# Patient Record
Sex: Female | Born: 1980 | Race: White | Hispanic: No | Marital: Married | Smoking: Never smoker
Health system: Southern US, Community
[De-identification: ages and names within clinical notes are randomized; demographics above are authoritative.]

## PROBLEM LIST (undated history)

## (undated) DIAGNOSIS — R519 Headache, unspecified: Secondary | ICD-10-CM

## (undated) DIAGNOSIS — E042 Nontoxic multinodular goiter: Secondary | ICD-10-CM

## (undated) DIAGNOSIS — K219 Gastro-esophageal reflux disease without esophagitis: Secondary | ICD-10-CM

## (undated) DIAGNOSIS — K921 Melena: Secondary | ICD-10-CM

## (undated) DIAGNOSIS — N390 Urinary tract infection, site not specified: Secondary | ICD-10-CM

## (undated) DIAGNOSIS — R51 Headache: Secondary | ICD-10-CM

## (undated) DIAGNOSIS — K589 Irritable bowel syndrome without diarrhea: Secondary | ICD-10-CM

## (undated) DIAGNOSIS — B019 Varicella without complication: Secondary | ICD-10-CM

## (undated) DIAGNOSIS — E049 Nontoxic goiter, unspecified: Secondary | ICD-10-CM

## (undated) HISTORY — DX: Gastro-esophageal reflux disease without esophagitis: K21.9

## (undated) HISTORY — PX: TONSILLECTOMY AND ADENOIDECTOMY: SUR1326

## (undated) HISTORY — DX: Varicella without complication: B01.9

## (undated) HISTORY — DX: Nontoxic multinodular goiter: E04.2

## (undated) HISTORY — DX: Irritable bowel syndrome, unspecified: K58.9

## (undated) HISTORY — DX: Nontoxic goiter, unspecified: E04.9

## (undated) HISTORY — DX: Headache, unspecified: R51.9

## (undated) HISTORY — DX: Melena: K92.1

## (undated) HISTORY — DX: Headache: R51

## (undated) HISTORY — DX: Urinary tract infection, site not specified: N39.0

---
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
## 2001-12-05 DIAGNOSIS — K219 Gastro-esophageal reflux disease without esophagitis: Secondary | ICD-10-CM

## 2001-12-05 HISTORY — DX: Gastro-esophageal reflux disease without esophagitis: K21.9

## 2001-12-07 ENCOUNTER — Other Ambulatory Visit: Admission: RE | Admit: 2001-12-07 | Discharge: 2001-12-07 | Payer: Self-pay | Admitting: Obstetrics and Gynecology

## 2002-12-09 ENCOUNTER — Other Ambulatory Visit: Admission: RE | Admit: 2002-12-09 | Discharge: 2002-12-09 | Payer: Self-pay | Admitting: Obstetrics and Gynecology

## 2003-12-12 ENCOUNTER — Other Ambulatory Visit: Admission: RE | Admit: 2003-12-12 | Discharge: 2003-12-12 | Payer: Self-pay | Admitting: Obstetrics and Gynecology

## 2004-11-12 ENCOUNTER — Other Ambulatory Visit: Admission: RE | Admit: 2004-11-12 | Discharge: 2004-11-12 | Payer: Self-pay | Admitting: Obstetrics and Gynecology

## 2005-11-04 ENCOUNTER — Other Ambulatory Visit: Admission: RE | Admit: 2005-11-04 | Discharge: 2005-11-04 | Payer: Self-pay | Admitting: Obstetrics and Gynecology

## 2007-01-05 ENCOUNTER — Other Ambulatory Visit: Admission: RE | Admit: 2007-01-05 | Discharge: 2007-01-05 | Payer: Self-pay | Admitting: Obstetrics and Gynecology

## 2007-06-05 HISTORY — PX: DILATION AND CURETTAGE OF UTERUS: SHX78

## 2007-06-10 LAB — ANTIBODY SCREEN: Antibody Screen: NEGATIVE

## 2007-06-18 ENCOUNTER — Inpatient Hospital Stay (HOSPITAL_COMMUNITY): Admission: AD | Admit: 2007-06-18 | Discharge: 2007-06-19 | Payer: Self-pay | Admitting: Obstetrics and Gynecology

## 2007-06-20 ENCOUNTER — Ambulatory Visit (HOSPITAL_COMMUNITY): Admission: RE | Admit: 2007-06-20 | Discharge: 2007-06-20 | Payer: Self-pay | Admitting: Obstetrics and Gynecology

## 2007-06-20 ENCOUNTER — Encounter (INDEPENDENT_AMBULATORY_CARE_PROVIDER_SITE_OTHER): Payer: Self-pay | Admitting: Obstetrics and Gynecology

## 2008-02-13 IMAGING — US US OB COMP LESS 14 WK
1 series · 14 of 28 positions shown · non-contrast
Comparison: None.

CLINICAL DATA: 25-year-old who is 12 weeks pregnant with bleeding and cramping.
 OBSTETRICAL ULTRASOUND <14 WKS AND TRANSVAGINAL OB US:
TECHNIQUE: Both transabdominal and transvaginal ultrasound examinations were performed for complete evaluation of the gestation as well as the maternal uterus, adnexal regions, and pelvic cul-de-sac.

[Series 1: us ob comp less 14 wks · 31 acquisitions, 14 frames shown]
[im 2/31]
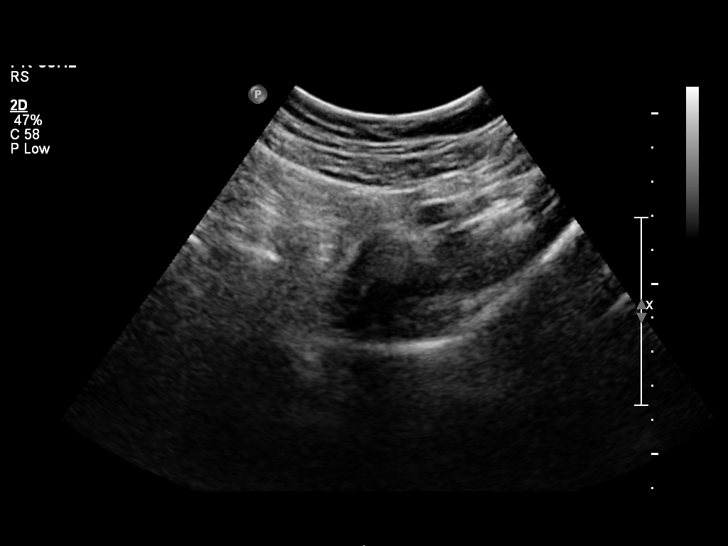
[im 4/31]
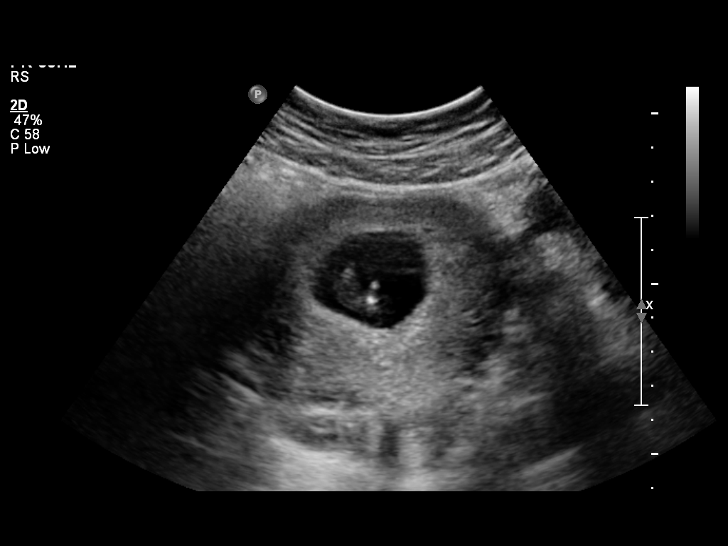
[im 6/31]
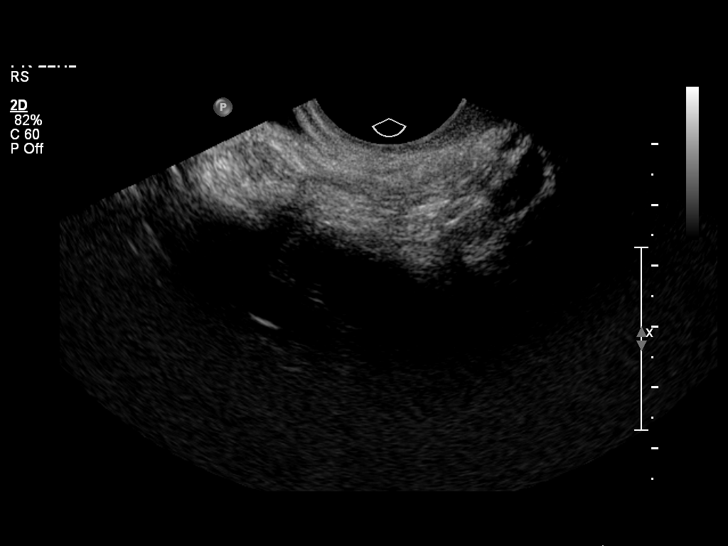
[im 8/31]
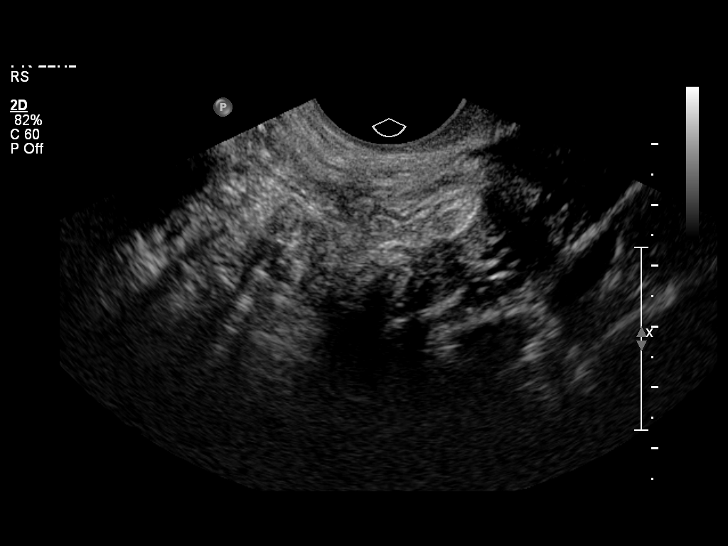
[im 11/31]
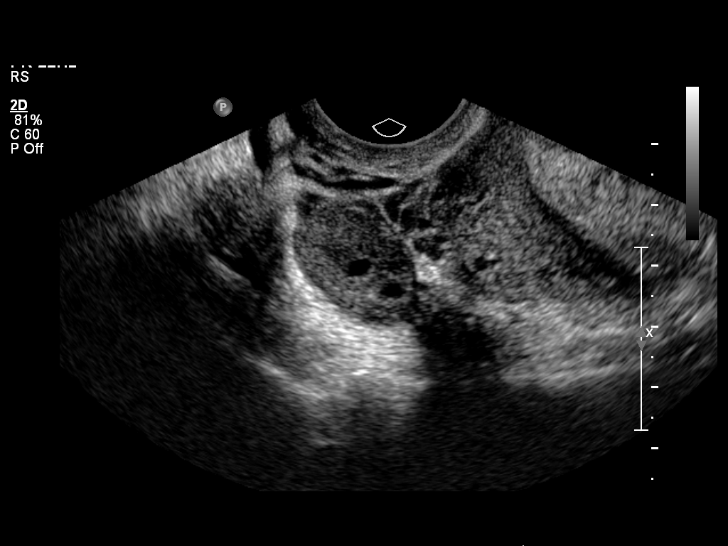
[im 13/31]
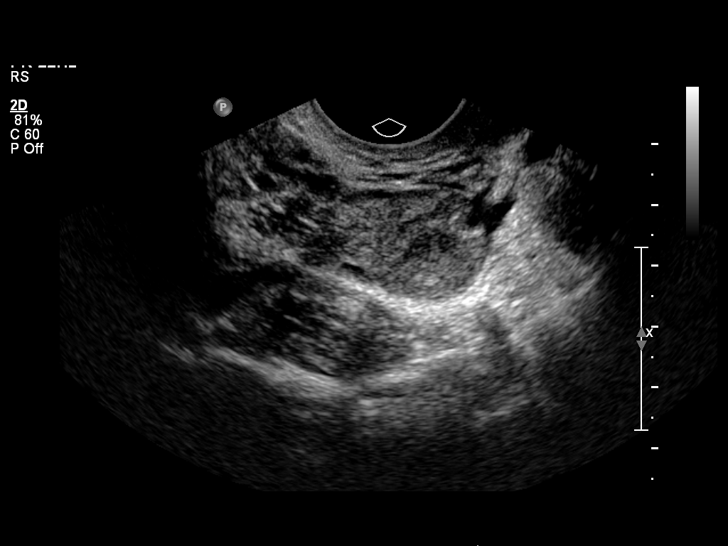
[im 15/31]
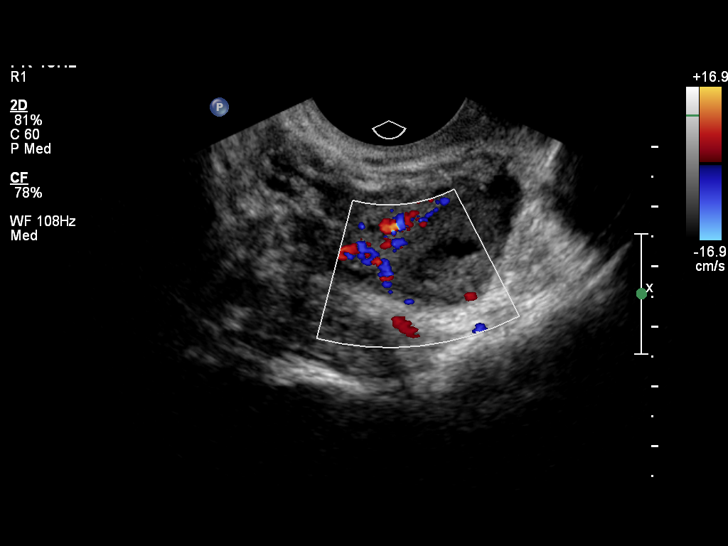
[im 17/31]
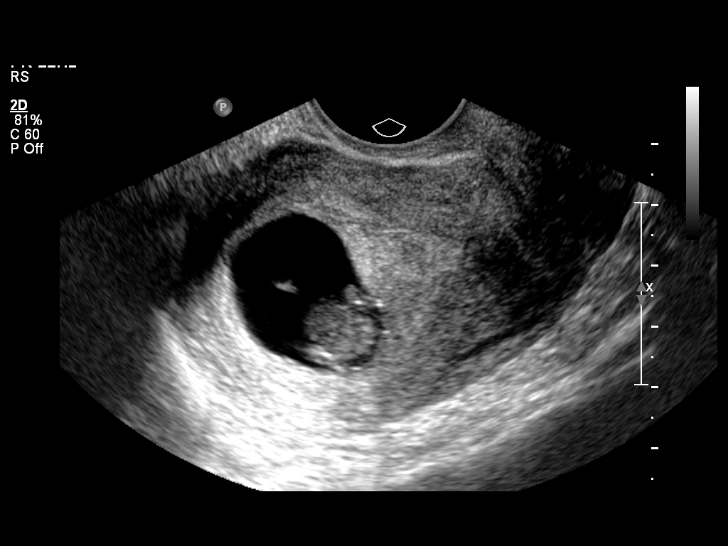
[im 19/31]
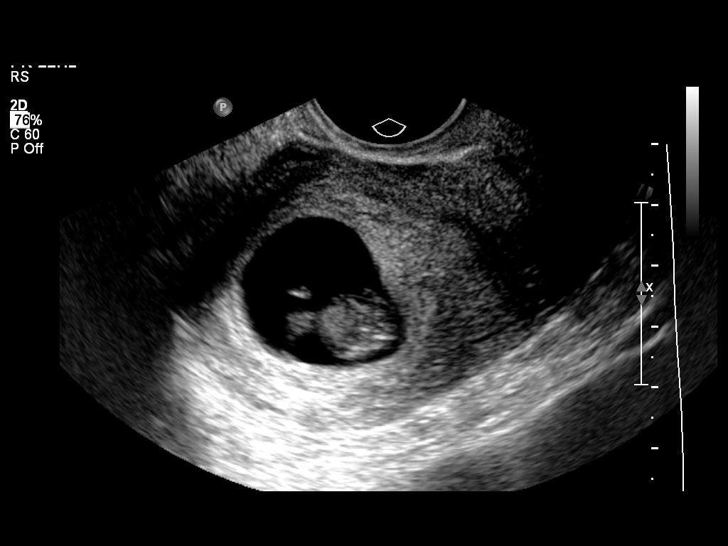
[im 22/31]
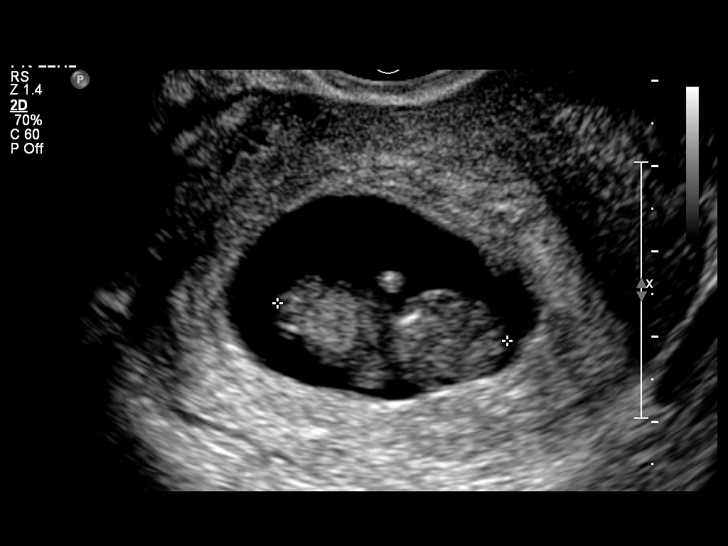
[im 24/31]
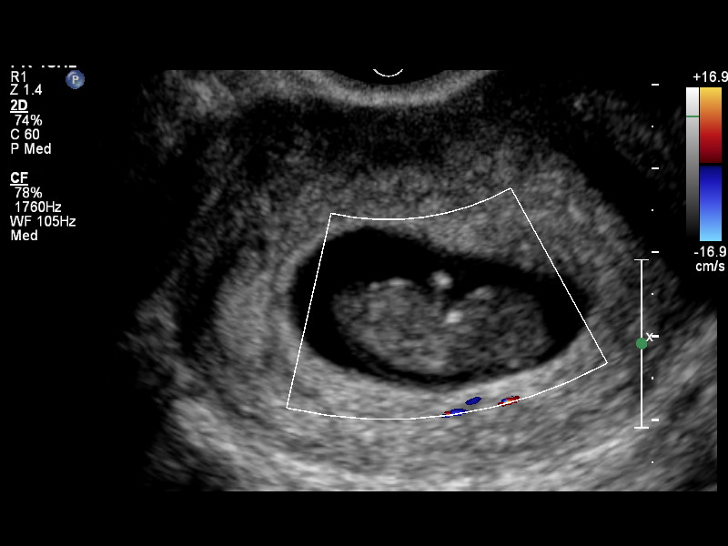
[im 26/31]
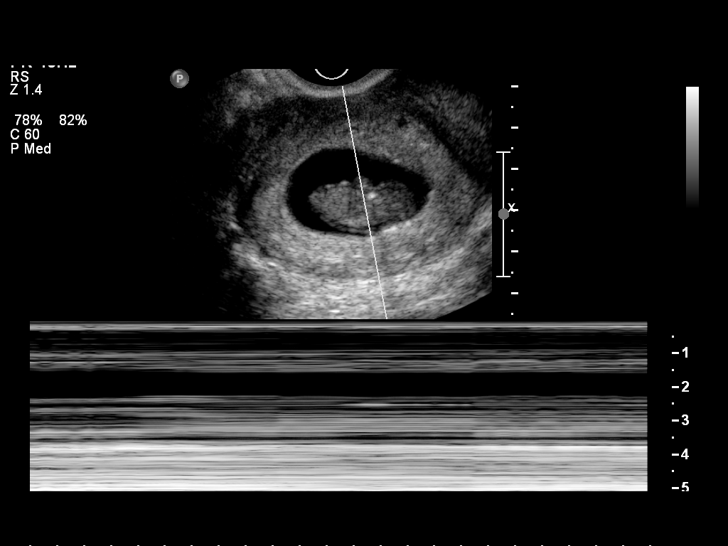
[im 28/31]
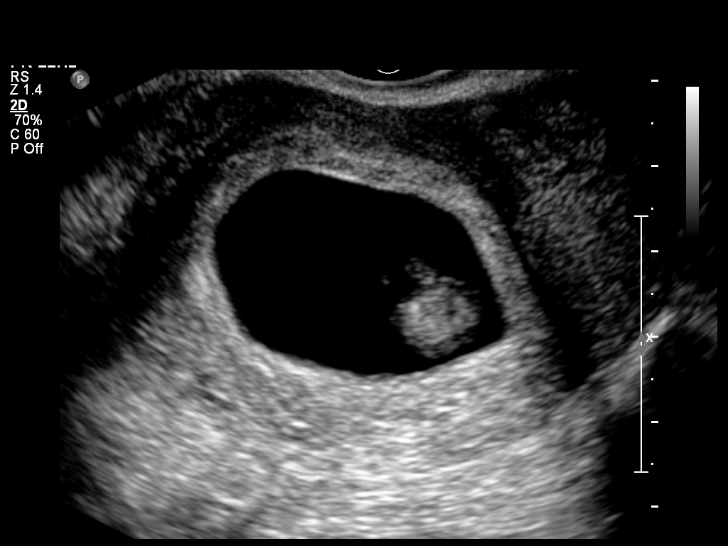
[im 31/31]
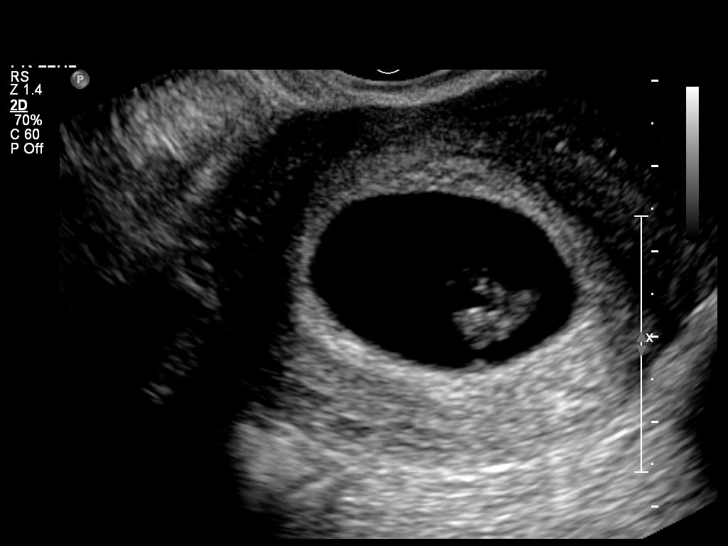

[14 of 28 positions shown; findings below may reference images not displayed]

FINDINGS: There is a single intrauterine gestational sac with a small fetal pole measuring 2.72 cm, which is consistent with a 9 week 4 day gestation. No cardiac activity is demonstrated. No yolk sac is seen.  No subchorionic hemorrhage.  The right ovary is visualized and appears normal. The left ovary is not seen. No free fluid.
IMPRESSION: Fetal pole measuring 9 weeks and 4 days but no cardiac activity is seen.  Findings are consistent with fetal demise.

## 2008-04-30 ENCOUNTER — Inpatient Hospital Stay (HOSPITAL_COMMUNITY): Admission: AD | Admit: 2008-04-30 | Discharge: 2008-04-30 | Payer: Self-pay | Admitting: Obstetrics and Gynecology

## 2008-04-30 ENCOUNTER — Inpatient Hospital Stay (HOSPITAL_COMMUNITY): Admission: AD | Admit: 2008-04-30 | Discharge: 2008-05-02 | Payer: Self-pay | Admitting: Obstetrics and Gynecology

## 2009-08-12 ENCOUNTER — Ambulatory Visit (HOSPITAL_COMMUNITY): Admission: RE | Admit: 2009-08-12 | Discharge: 2009-08-12 | Payer: Self-pay | Admitting: Obstetrics and Gynecology

## 2009-12-09 ENCOUNTER — Inpatient Hospital Stay (HOSPITAL_COMMUNITY): Admission: AD | Admit: 2009-12-09 | Discharge: 2009-12-10 | Payer: Self-pay | Admitting: Obstetrics and Gynecology

## 2011-02-20 LAB — CBC
HCT: 30.2 % — ABNORMAL LOW (ref 36.0–46.0)
Hemoglobin: 11.6 g/dL — ABNORMAL LOW (ref 12.0–15.0)
MCV: 92 fL (ref 78.0–100.0)
RBC: 3.28 MIL/uL — ABNORMAL LOW (ref 3.87–5.11)
RBC: 3.82 MIL/uL — ABNORMAL LOW (ref 3.87–5.11)
WBC: 11.4 10*3/uL — ABNORMAL HIGH (ref 4.0–10.5)

## 2011-02-20 LAB — RPR: RPR Ser Ql: NONREACTIVE

## 2011-04-19 NOTE — Op Note (Signed)
NAMEWHITNEY, Sherry Chavez             ACCOUNT NO.:  [id number]   MEDICAL RECORD NO.:  [id number]          PATIENT TYPE:  AMB   LOCATION:  SDC                           [facility]   PHYSICIAN:  Janine Limbo, M.D.DATE OF BIRTH:  October 10, 1981   DATE OF PROCEDURE:  06/20/2007  DATE OF DISCHARGE:                               OPERATIVE REPORT   PREOPERATIVE DIAGNOSIS:  First trimester incomplete abortion.   POSTOPERATIVE DIAGNOSIS:  First trimester incomplete abortion.   PROCEDURE:  Suction dilatation and evacuation.   SURGEON:  Dr. Leonard Schwartz   ASSISTANT:  None.   ANESTHETIC:  Is general.   DISPOSITION:  The patient is a 30 year old female, gravida 2, para 0-0-1-  0, who presents with the first trimester miscarriage confirmed by  ultrasound.  She understands the indications for her surgical procedure  and she accepts the risks of, but not limited to, anesthetic  complications, bleeding, infection, and possible damage to surrounding  organs.   FINDINGS:  The patient's blood type is A+.  The uterus was approximately  8-10 weeks size.  A moderate amount of products of conception were  removed from within the uterus.   PROCEDURE:  The patient was taken to the operating room where general  anesthetic was given.  The patient's lower abdomen, perineum, and vagina  were prepped with multiple layers of Betadine.  The bladder was drained  of urine.  The patient was sterilely draped.  Examination under  anesthesia was performed.  A paracervical block was placed using 10 mL  of half percent Marcaine with epinephrine.  An additional 10 mL of half  percent Marcaine with epinephrine were injected directly into the  cervix.  The uterus sounded to 9 cm.  The cervix was gently dilated.  The uterine cavity was evacuated using a size 8 suction curette followed  by medium sharp curette.  The cavity was felt to be clean at the end of  our procedure.  Hemostasis was adequate.  All  instruments were removed.  The examination was repeated and the uterus was noted to be firm.  The  patient was awakened from her anesthetic and taken to the recovery room  in stable condition.  She tolerated her procedure well.  The products of  conception was sent to pathology for evaluation.   FOLLOW-UP INSTRUCTIONS:  The patient was given a prescription for  ibuprofen and she will take 800 mg every 8 hours as needed for pain.  She will also take Darvocet-N 100 1 or 2 tablets every 4-6 hours as  needed for  pain.  She will return to see Dr. Stefano Gaul in 2-3 weeks for follow-up  examination.  She was given a copy of the postoperative instruction  sheet as prepared by the [facility] for patients who  have undergone a dilatation and curettage.      Janine Limbo, M.D.  Electronically Signed     AVS/MEDQ  D:  06/20/2007  T:  06/20/2007  Job:  [id number]

## 2011-04-19 NOTE — H&P (Signed)
NAMECAYLEE, Sherry Chavez             ACCOUNT NO.:  [id number]   MEDICAL RECORD NO.:  [id number]           PATIENT TYPE:   LOCATION:  MATC                          [facility]:  [facility]   PHYSICIAN:  Janine Limbo, M.D.DATE OF BIRTH:  Mar 18, 1981   DATE OF ADMISSION:  06/20/2007  DATE OF DISCHARGE:                              HISTORY & PHYSICAL   HISTORY:  Ms. Sherry Chavez is a 30 year old female, gravida 2, para 0, 0, 1,  0, who presents at [redacted] weeks gestation (EDC is December 30, 2007).  She  has an incomplete miscarriage.  She has been followed at the [facility] and [facility] Division of [facility].  The patient had an ultrasound performed on June 18, 2007 that  showed a non viable first trimester miscarriage.   OBSTETRICAL HISTORY:  The patient had a miscarriage in the first  trimester in December of 2007.   ALLERGIES:  No known drug allergies but CODEINE causes vomiting.   PAST MEDICAL HISTORY:  The patient denies hypertension and diabetes.  She does have irritable bowel syndrome.  She had her wisdom teeth  removed at age 27. She had her tonsils and adenoids removed at age 89.   SOCIAL HISTORY:  The patient denies cigarette use, alcohol use, and  recreational drug use.   REVIEW OF SYSTEMS:  Noncontributory.   FAMILY HISTORY:  Noncontributory.   PHYSICAL EXAMINATION:  Weight is 150 pounds.  HEENT:  Within normal limits.  CHEST:  Clear.  HEART:  Regular rate and rhythm.  BREASTS:  Are without masses.  ABDOMEN:  Nontender.  EXTREMITIES:  Are grossly normal.  NEUROLOGIC EXAM:  Grossly normal.   PELVIC EXAM:  External genitalia is normal, vagina is normal, cervix is  nontender. Uterus is approximately 10-weeks size when last checked.  Adnexa no masses.   LABORATORY VALUES:  Blood type is A positive, antibody screen negative.  VDRL nonreactive.  HBSAG negative.  HIV nonreactive.   Ultrasound - non viable first trimester gestation.   ASSESSMENT:  First trimester incomplete abortion.   PLAN:  The patient will undergo dilatation and evacuation.  She  understands the indications for her surgical procedure and she accepts  the risk of, but not limited to, anesthetic complications, bleeding,  infections, and possible damage to the surrounding organs.      Janine Limbo, M.D.  Electronically Signed     AVS/MEDQ  D:  06/19/2007  T:  06/19/2007  Job:  [id number]

## 2011-04-19 NOTE — H&P (Signed)
Sherry Chavez, Sherry Chavez             ACCOUNT NO.:  [id number]   MEDICAL RECORD NO.:  [id number]          PATIENT TYPE:  INP   LOCATION:  9165                          [facility]ILITY:  [facility]   PHYSICIAN:  Crist Fat. Rivard, M.D. DATE OF BIRTH:  1981/05/20   DATE OF ADMISSION:  04/30/2008  DATE OF DISCHARGE:                              HISTORY & PHYSICAL   Ms. Boulay is a 30 year old gravida 3, para 0-0-2-0, at 40-4/7 weeks,  who presented complaining of increased contractions since approximately  4 a.m.  She was seen earlier tonight in maternity admissions, with  cervix 1 cm, 80%, and no cervical change after walking.  She was sent  home.  She reports she slept approximately 1 hour and then awoke with  increased contractions and some questionable leaking.   Pregnancy has been remarkable for:  1. Two SABs.  2. Family history of neural tube defect, hydrocephaly, in the      patient's sister.  3. Allergy to CODEINE.  4. Irritable bowel syndrome, but stable during pregnancy.  5. Group B Streptococcus negative.   PRENATAL LABORATORIES:  Blood type is A positive, Rh antibody negative,  VDRL nonreactive, rubella titer positive, hepatitis B surface antigen  negative, HIV is nonreactive.  Cystic fibrosis testing was declined.  Hemoglobin upon entering into practice was 14.1.  It was 12 at 27 weeks.  GC and Chlamydia cultures were negative.  First trimester screen was  normal.  AFP was normal.  Her Glucola was normal at 89.  Group B  Streptococcus culture was done at 35 weeks and was negative.  She had a  negative fetal fibronectin at 33 weeks.   HISTORY OF PRESENT PREGNANCY:  The patient entered care at approximately  9 weeks.  She had had some first trimester spotting.  She was on  progesterone suppositories secondary to history of two SABs.  She had an  ultrasound at her first visit secondary to inability to hear fetal heart  tones.  EDC was Apr 26, 2008 by LMP, Apr 30, 2008 by a 6-week  ultrasound, and Apr 29, 2008 by a 9-week ultrasound.  She had a normal  first trimester screen and normal AFP.  She had an ultrasound in 19  weeks showing normal growth.  She had some cramping at 20 weeks.  She  had a normal Glucola.  She did have some insomnia at 29 weeks, did not  require any prescriptive medications.  She had lots of contractions at  33 weeks.  Fetal fibronectin was done and was negative, and cervix  closed and long.  Group B Streptococcus culture other cultures were  negative at 35 weeks.  The rest of her pregnancy was essentially  uncomplicated.   OBSTETRICAL HISTORY:  In 2007 and 2008. the patient had miscarriages.  In 2007, it was a 5-week miscarriage which passed spontaneously.  In  2008, it was a 10-week miscarriage that required a D&C.   MEDICAL HISTORY:  She was on oral contraceptives until November 2007.  She does have a history of irritable bowel diagnosed to 2007, with hers  being remarkable for diarrhea.  She has had no medications.  She reports  one UTI.  She has had surgical history with wisdom teeth removed at age  40, tonsils and adenoids at age 63, and a previously noted 2008 D&E.  She did have some vomiting and nausea upon awakening from that.   The patient is allergic to CODEINE, which causes vomiting and stomach  pain.   FAMILY HISTORY:  Her mother is hypertensive, on medication.  Her mother  has varicosities and anemia.  Her mother does have some glucose  instability.  Her maternal grandmother is a diabetic.  Her mother has  some type of adrenal problem but has not required any treatment.  Her  mother has migraines.  Maternal grandfather is an alcohol user.   GENETIC HISTORY:  Remarkable the patient's sister having spina bifida  and hydrocephalus with a shunt.  She had an L4-L5 lesion.  She is able  to walk.   SOCIAL HISTORY:  The patient is married to the father of the baby.  He  is involved and supportive.  His name is Erick Oxendine.  The  patient is  Caucasian, of the [place] faith.  The patient has a college degree.  She is a Engineering geologist.  Her husband has two years of college.  He is  an Engineer, petroleum.  She has been followed by the certified years  midwife service at [facility].  She denies any alcohol, drug,  or tobacco use during this pregnancy.   PHYSICAL EXAMINATION:  VITAL SIGNS:  Stable.  The patient is afebrile.  HEENT:  Within normal limits.  LUNGS:  Breath sounds are clear.  HEART:  Regular rate and rhythm, without murmur.  BREASTS:  Soft and nontender.  ABDOMEN:  Fundal height is approximately 39 cm.  Estimated fetal weight  7 - 7-1/2 pounds.  Uterine contractions every 4-5 minutes, moderate  quality.  Cervix was 3 cm, 80% vertex, at a -1 station, with bulging bag  of water per the nurse in maternity admissions.  No leaking was noted.  Fetal heart rate has been reactive, with no decelerations.  EXTREMITIES:  Deep tendon reflexes are 2+, without clonus.  There is  trace edema noted.   IMPRESSION:  1. Intrauterine pregnancy at 40-4/7 weeks.  2. Early labor.  3. Negative group B Streptococcus.   PLAN:  1. Birthing suite consult with Dr. Estanislado Pandy as attending physician.  2. Routine certified nurse midwife orders.  3. The patient declines pain medication at present.  4. Will recheck this morning after the patient walks per request.      Renaldo Reel. Emilee Hero, C.N.M.      Crist Fat Rivard, M.D.  Electronically Signed    VLL/MEDQ  D:  04/30/2008  T:  04/30/2008  Job:  [id number]

## 2011-08-18 LAB — RPR: RPR: NONREACTIVE

## 2011-08-18 LAB — GC/CHLAMYDIA PROBE AMP, GENITAL

## 2011-08-31 LAB — CBC
HCT: 36.3
Hemoglobin: 12.4
Hemoglobin: 9.8 — ABNORMAL LOW
MCHC: 34
MCV: 87.6
MCV: 88.7
Platelets: 241
RDW: 13.7
RDW: 14.3
WBC: 14.4 — ABNORMAL HIGH

## 2011-09-19 LAB — URINE MICROSCOPIC-ADD ON

## 2011-09-19 LAB — GC/CHLAMYDIA PROBE AMP, GENITAL: Chlamydia, DNA Probe: NEGATIVE

## 2011-09-19 LAB — URINALYSIS, ROUTINE W REFLEX MICROSCOPIC
Bilirubin Urine: NEGATIVE
Glucose, UA: NEGATIVE
Ketones, ur: NEGATIVE
Nitrite: NEGATIVE
Specific Gravity, Urine: 1.015
pH: 7.5

## 2011-09-19 LAB — CBC
HCT: 40.5
Hemoglobin: 13.6
RBC: 4.6
WBC: 6.2

## 2011-09-20 LAB — URINALYSIS, ROUTINE W REFLEX MICROSCOPIC
Glucose, UA: NEGATIVE
Protein, ur: NEGATIVE
Specific Gravity, Urine: 1.015
pH: 6

## 2011-09-20 LAB — URINE MICROSCOPIC-ADD ON

## 2011-12-06 NOTE — L&D Delivery Note (Signed)
Delivery Note At 9:31 PM a viable and healthy female was delivered via  (Presentation: Right Occiput Anterior with posterior arm by head).  APGAR: 9, 9; weight 7 lb 11 oz (3487 g).   Placenta status: Intact, Spontaneous.  Cord: 3 vessels with the following complications: None.  Cord pH:   Anesthesia:  None Episiotomy: None Lacerations: 1st degree Superficial Suture Repair: None Est. Blood Loss (mL): 250  Mom to postpartum.  Baby to nursery-stable.  Delivery supervised by Philipp Deputy, CNM  Ala Dach 02/28/2012, 9:53 PM

## 2012-02-03 LAB — STREP B DNA PROBE: GBS: NEGATIVE

## 2012-02-28 ENCOUNTER — Inpatient Hospital Stay (HOSPITAL_COMMUNITY)
Admission: AD | Admit: 2012-02-28 | Discharge: 2012-03-01 | DRG: 775 | Disposition: A | Discharge status: Home / Self Care | Payer: 59 | Source: Ambulatory Visit | Attending: Obstetrics and Gynecology | Admitting: Obstetrics and Gynecology

## 2012-02-28 ENCOUNTER — Encounter (HOSPITAL_COMMUNITY): Payer: Self-pay | Admitting: Obstetrics

## 2012-02-28 LAB — CBC
HCT: 36.7 % (ref 36.0–46.0)
Hemoglobin: 12.2 g/dL (ref 12.0–15.0)
MCV: 87.6 fL (ref 78.0–100.0)
Platelets: 215 10*3/uL (ref 150–400)
RBC: 4.19 MIL/uL (ref 3.87–5.11)
WBC: 16.5 10*3/uL — ABNORMAL HIGH (ref 4.0–10.5)

## 2012-02-28 MED ORDER — ACETAMINOPHEN 325 MG PO TABS: 650.0000 mg | ORAL_TABLET | ORAL | Status: DC | PRN

## 2012-02-28 MED ORDER — LACTATED RINGERS IV SOLN: INTRAVENOUS | Status: DC

## 2012-02-28 MED ORDER — ONDANSETRON HCL 4 MG/2ML IJ SOLN: 4.0000 mg | Freq: Four times a day (QID) | INTRAMUSCULAR | Status: DC | PRN

## 2012-02-28 MED ORDER — IBUPROFEN 600 MG PO TABS
600.0000 mg | ORAL_TABLET | Freq: Four times a day (QID) | ORAL | Status: DC | PRN
  Administered 2012-02-28: 600 mg via ORAL
  Filled 2012-02-28: qty 1

## 2012-02-28 MED ORDER — LACTATED RINGERS IV SOLN: 500.0000 mL | INTRAVENOUS | Status: DC | PRN

## 2012-02-28 MED ORDER — OXYTOCIN 20 UNITS IN LACTATED RINGERS INFUSION - SIMPLE: 125.0000 mL/h | Freq: Once | INTRAVENOUS | Status: DC

## 2012-02-28 MED ORDER — CITRIC ACID-SODIUM CITRATE 334-500 MG/5ML PO SOLN: 30.0000 mL | ORAL | Status: DC | PRN

## 2012-02-28 MED ORDER — OXYTOCIN BOLUS FROM INFUSION
500.0000 mL | Freq: Once | INTRAVENOUS | Status: DC
  Filled 2012-02-28: qty 500

## 2012-02-28 MED ORDER — FLEET ENEMA 7-19 GM/118ML RE ENEM: 1.0000 | ENEMA | RECTAL | Status: DC | PRN

## 2012-02-28 MED ORDER — LIDOCAINE HCL (PF) 1 % IJ SOLN: 30.0000 mL | INTRAMUSCULAR | Status: DC | PRN

## 2012-02-28 MED ORDER — OXYCODONE-ACETAMINOPHEN 5-325 MG PO TABS: 1.0000 | ORAL_TABLET | ORAL | Status: DC | PRN

## 2012-02-28 NOTE — H&P (Signed)
Sherry Chavez is a 31 y.o. female [id number] who presents at 39 weeks and 4 days who presents with strong contractions and urge to push. Membranes were stripped in office today. Contractions became strong and frequent after that, every couple minutes. Prenatal care at [facility]. No complications with pregnancy. Gush of fluid in stretcher during transfer in L&D. Precipitous delivery. Estimated Date of Delivery: 03/02/2012 based on LMP and early U/S. Plans to breast feed. Contraception will be vasectomy. Pediatrician will be Dr. Milford Cage in [place].  Maternal Medical History:  Contractions: Frequency: rare.      No past medical history on file. Past Medical History None Past Surgical History: D&C, T&A Allergies: Codeine No past surgical history on file. Family History: family history is not on file. Social History:  does not have a smoking history on file. She does not have any smokeless tobacco history on file. Her alcohol and drug histories not on file.  ROS See HPI  Dilation: 7.5 Effacement (%): 80 Station: -3 Exam by:: DN Blood pressure 121/67, pulse 100. Exam Physical Exam  Constitutional: She is oriented to person, place, and time. She appears well-developed and well-nourished. She appears distressed.  HENT:  Head: Normocephalic and atraumatic.  Eyes: EOM are normal.  Neck: Normal range of motion.  Respiratory: Effort normal. No respiratory distress.  GI: She exhibits distension (gravid).  Genitourinary: Vagina normal.  Musculoskeletal: Normal range of motion.  Neurological: She is alert and oriented to person, place, and time. No cranial nerve deficit.  Skin: Skin is warm and dry. She is not diaphoretic. No erythema.  Psychiatric: She has a normal mood and affect. Her behavior is normal. Judgment and thought content normal.    Prenatal labs: ABO, Rh:  A+ Antibody:   Rubella:  Immune RPR:   neg HBsAg:   neg HIV:   neg GBS:   neg 2 hr glucose 82, 109, 95 Quad screen  normal Anatomic U/S normal  Assessment/Plan: 31 y.o. female [id number] who presents at 39 weeks and 4 days Admit to L&D Precipitous Delivery Breast feed Vasectomy for contraception  Ala Dach 02/28/2012, 10:09 PM

## 2012-02-29 ENCOUNTER — Encounter (HOSPITAL_COMMUNITY): Payer: Self-pay | Admitting: *Deleted

## 2012-02-29 MED ORDER — DIPHENHYDRAMINE HCL 25 MG PO CAPS: 25.0000 mg | ORAL_CAPSULE | Freq: Four times a day (QID) | ORAL | Status: DC | PRN

## 2012-02-29 MED ORDER — OXYCODONE-ACETAMINOPHEN 5-325 MG PO TABS: 1.0000 | ORAL_TABLET | ORAL | Status: DC | PRN

## 2012-02-29 MED ORDER — TETANUS-DIPHTH-ACELL PERTUSSIS 5-2.5-18.5 LF-MCG/0.5 IM SUSP: 0.5000 mL | Freq: Once | INTRAMUSCULAR | Status: DC

## 2012-02-29 MED ORDER — WITCH HAZEL-GLYCERIN EX PADS: 1.0000 "application " | MEDICATED_PAD | CUTANEOUS | Status: DC | PRN

## 2012-02-29 MED ORDER — IBUPROFEN 600 MG PO TABS
600.0000 mg | ORAL_TABLET | Freq: Four times a day (QID) | ORAL | Status: DC
  Administered 2012-02-29 – 2012-03-01 (×5): 600 mg via ORAL
  Filled 2012-02-29 (×4): qty 1

## 2012-02-29 MED ORDER — ONDANSETRON HCL 4 MG PO TABS: 4.0000 mg | ORAL_TABLET | ORAL | Status: DC | PRN

## 2012-02-29 MED ORDER — ONDANSETRON HCL 4 MG/2ML IJ SOLN: 4.0000 mg | INTRAMUSCULAR | Status: DC | PRN

## 2012-02-29 MED ORDER — SIMETHICONE 80 MG PO CHEW: 80.0000 mg | CHEWABLE_TABLET | ORAL | Status: DC | PRN

## 2012-02-29 MED ORDER — DIBUCAINE 1 % RE OINT: 1.0000 "application " | TOPICAL_OINTMENT | RECTAL | Status: DC | PRN

## 2012-02-29 MED ORDER — ZOLPIDEM TARTRATE 5 MG PO TABS: 5.0000 mg | ORAL_TABLET | Freq: Every evening | ORAL | Status: DC | PRN

## 2012-02-29 MED ORDER — BENZOCAINE-MENTHOL 20-0.5 % EX AERO: 1.0000 "application " | INHALATION_SPRAY | CUTANEOUS | Status: DC | PRN

## 2012-02-29 MED ORDER — LANOLIN HYDROUS EX OINT: TOPICAL_OINTMENT | CUTANEOUS | Status: DC | PRN

## 2012-02-29 MED ORDER — SENNOSIDES-DOCUSATE SODIUM 8.6-50 MG PO TABS
2.0000 | ORAL_TABLET | Freq: Every day | ORAL | Status: DC
  Administered 2012-02-29: 2 via ORAL

## 2012-02-29 MED ORDER — PRENATAL MULTIVITAMIN CH
1.0000 | ORAL_TABLET | Freq: Every day | ORAL | Status: DC
  Administered 2012-02-29: 1 via ORAL
  Filled 2012-02-29 (×2): qty 1

## 2012-02-29 NOTE — Progress Notes (Addendum)
Post Partum Day 1 Subjective: no complaints, up ad lib, voiding, tolerating PO and + flatus Breastfeeding is going well. No stool yet.  Objective: Blood pressure 110/73, pulse 77, temperature 98.1 F (36.7 C), temperature source Oral, resp. rate 16, height 5\' 5"  (1.651 m), weight 81.647 kg (180 lb), SpO2 95.00%, unknown if currently breastfeeding.  Physical Exam:  General: alert, cooperative, appears stated age and no distress Lochia: appropriate Uterine Fundus: firm Incision: n/a DVT Evaluation: No evidence of DVT seen on physical exam. Negative Homan's sign. No cords or calf tenderness. No significant calf/ankle edema.   Basename 02/28/12 2245  HGB 12.2  HCT 36.7    Assessment/Plan: Plan for discharge tomorrow, Breastfeeding and Contraception vasectomy   LOS: 1 day   Ala Dach 02/29/2012, 7:35 AM    I have seen and examined this patient and I agree with the above. Cam Hai 7:43 AM 02/29/2012

## 2012-02-29 NOTE — H&P (Signed)
Agree with above note.  Sherry Chavez 02/29/2012 7:56 AM

## 2012-03-01 NOTE — Progress Notes (Addendum)
Post Partum Day 2 Subjective: no complaints, up ad lib, voiding, tolerating PO and + flatus  Objective: Blood pressure 101/67, pulse 84, temperature 97.8 F (36.6 C), temperature source Oral, resp. rate 18, height 5\' 5"  (1.651 m), weight 81.647 kg (180 lb), last menstrual period 05/27/2011, SpO2 99.00%, unknown if currently breastfeeding.  Physical Exam:  General: alert, cooperative, appears stated age and no distress Lochia: appropriate Uterine Fundus: firm Incision: n/a DVT Evaluation: No evidence of DVT seen on physical exam. Negative Homan's sign. No cords or calf tenderness. No significant calf/ankle edema.   Basename 02/28/12 2245  HGB 12.2  HCT 36.7    Assessment/Plan: Discharge home, Breastfeeding and Contraception vasectomy   LOS: 2 days   Ala Dach 03/01/2012, 7:18 AM    I was present for the exam and agree with above.  Dorathy Kinsman 03/01/2012 9:57 AM

## 2012-03-01 NOTE — Discharge Summary (Signed)
Chart reviewed and agree with management and plan.  

## 2012-03-01 NOTE — Discharge Instructions (Signed)

## 2012-03-01 NOTE — Discharge Summary (Signed)
Obstetric Discharge Summary Reason for Admission: onset of labor Prenatal Procedures: none Intrapartum Procedures: spontaneous vaginal delivery Postpartum Procedures: none Complications-Operative and Postpartum: 1st degree superficial Hemoglobin  Date Value Range Status  02/28/2012 12.2  12.0-15.0 (g/dL) Final     HCT  Date Value Range Status  02/28/2012 36.7  36.0-46.0 (%) Final    Physical Exam:  General: alert, cooperative, appears stated age and no distress Lochia: appropriate Uterine Fundus: firm Incision: n/a DVT Evaluation: No evidence of DVT seen on physical exam. Negative Homan's sign. No cords or calf tenderness. No significant calf/ankle edema.  Discharge Diagnoses: Term Pregnancy-delivered  Discharge Information: Date: 03/01/2012 Activity: unrestricted Diet: routine Medications: None Condition: stable and improved Instructions: refer to practice specific booklet Discharge to: home Follow-up Information    Follow up with [facility]. Schedule an appointment as soon as possible for a visit in 6 weeks.   Contact information:   [address] [place] [address]          Newborn Data: Live born female  Birth Weight: 7 lb 11 oz (3487 g) APGAR: 9, 9  Home with mother.  Ala Dach 03/01/2012, 7:19 AM  I was present for the exam and agree with above.  Dorathy Kinsman 03/01/2012 9:58 AM

## 2012-10-18 ENCOUNTER — Encounter: Payer: Self-pay | Admitting: Gastroenterology

## 2012-10-19 ENCOUNTER — Encounter: Payer: Self-pay | Admitting: Gastroenterology

## 2012-11-09 ENCOUNTER — Ambulatory Visit: Payer: 59 | Admitting: Gastroenterology

## 2012-11-13 ENCOUNTER — Ambulatory Visit (INDEPENDENT_AMBULATORY_CARE_PROVIDER_SITE_OTHER): Payer: 59 | Admitting: Gastroenterology

## 2012-11-13 ENCOUNTER — Encounter: Payer: Self-pay | Admitting: Gastroenterology

## 2012-11-13 VITALS — BP 104/66 | HR 84 | Ht 64.75 in | Wt 150.4 lb

## 2012-11-13 DIAGNOSIS — R198 Other specified symptoms and signs involving the digestive system and abdomen: Secondary | ICD-10-CM

## 2012-11-13 DIAGNOSIS — K921 Melena: Secondary | ICD-10-CM

## 2012-11-13 DIAGNOSIS — R109 Unspecified abdominal pain: Secondary | ICD-10-CM

## 2012-11-13 MED ORDER — PEG-KCL-NACL-NASULF-NA ASC-C 100 G PO SOLR: 1.0000 | Freq: Once | ORAL | Status: DC

## 2012-11-13 NOTE — Progress Notes (Signed)
History of Present Illness: This is a 31 year old female who relates a change in bowel habits with looser stools associated with mucous and rectal bleeding and mild lower abdominal cramping. The rectal bleeding was occurring with most bowel movements for approximately 2 weeks but abated about one week ago. Frequent stools associated with mucous persists. She denies any antibiotic usage, new medications or new diet. When her rectal bleeding was occurring on a daily basis she noted regular low back pain. Denies weight loss, constipation, change in stool caliber, melena, nausea, vomiting, dysphagia, reflux symptoms, chest pain.  Review of Systems: Pertinent positive and negative review of systems were noted in the above HPI section. All other review of systems were otherwise negative.  Current Medications, Allergies, Past Medical History, Past Surgical History, Family History and Social History were reviewed in [employer] record.  Physical Exam: General: Well developed , well nourished, no acute distress Head: Normocephalic and atraumatic Eyes:  sclerae anicteric, EOMI Ears: Normal auditory acuity Mouth: No deformity or lesions Neck: Supple, no masses or thyromegaly Lungs: Clear throughout to auscultation Heart: Regular rate and rhythm; no murmurs, rubs or bruits Abdomen: Soft, non tender and non distended. No masses, hepatosplenomegaly or hernias noted. Normal Bowel sounds Rectal: no lesions noted, heme negative brown stool in the rectum Musculoskeletal: Symmetrical with no gross deformities  Skin: No lesions on visible extremities Pulses:  Normal pulses noted Extremities: No clubbing, cyanosis, edema or deformities noted Neurological: Alert oriented x 4, grossly nonfocal Cervical Nodes:  No significant cervical adenopathy Inguinal Nodes: No significant inguinal adenopathy Psychological:  Alert and cooperative. Normal mood and affect  Assessment and  Recommendations:  1. Bloody diarrhea with mucous. Symptoms improving. Etiology unclear-possible IBD. Schedule colonoscopy. The risks, benefits, and alternatives to colonoscopy with possible biopsy and possible polypectomy were discussed with the patient and they consent to proceed.

## 2012-11-13 NOTE — Patient Instructions (Addendum)
You have been scheduled for a colonoscopy with propofol. Please follow written instructions given to you at your visit today.  Please pick up your prep kit at the pharmacy within the next 1-3 days. If you use inhalers (even only as needed) or a CPAP machine, please bring them with you on the day of your procedure.  

## 2012-11-15 ENCOUNTER — Ambulatory Visit: Payer: 59 | Admitting: Family Medicine

## 2012-11-16 ENCOUNTER — Ambulatory Visit: Payer: 59 | Admitting: Family Medicine

## 2012-12-03 ENCOUNTER — Encounter: Payer: Self-pay | Admitting: Gastroenterology

## 2012-12-03 ENCOUNTER — Ambulatory Visit (AMBULATORY_SURGERY_CENTER): Payer: 59 | Admitting: Gastroenterology

## 2012-12-03 VITALS — BP 98/62 | HR 66 | Temp 98.0°F | Resp 19 | Ht 64.0 in | Wt 150.0 lb

## 2012-12-03 DIAGNOSIS — R197 Diarrhea, unspecified: Secondary | ICD-10-CM

## 2012-12-03 DIAGNOSIS — D126 Benign neoplasm of colon, unspecified: Secondary | ICD-10-CM

## 2012-12-03 DIAGNOSIS — K921 Melena: Secondary | ICD-10-CM

## 2012-12-03 DIAGNOSIS — R198 Other specified symptoms and signs involving the digestive system and abdomen: Secondary | ICD-10-CM

## 2012-12-03 MED ORDER — SODIUM CHLORIDE 0.9 % IV SOLN: 500.0000 mL | INTRAVENOUS | Status: DC

## 2012-12-03 NOTE — Progress Notes (Signed)
Stable report to RN

## 2012-12-03 NOTE — Progress Notes (Signed)
Called to room to assist during endoscopic procedure.  Patient ID and intended procedure confirmed with present staff. Received instructions for my participation in the procedure from the performing physician.  

## 2012-12-03 NOTE — Patient Instructions (Addendum)

## 2012-12-03 NOTE — Progress Notes (Signed)
Patient did not experience any of the following events: a burn prior to discharge; a fall within the facility; wrong site/side/patient/procedure/implant event; or a hospital transfer or hospital admission upon discharge from the facility. (G8907) Patient did not have preoperative order for IV antibiotic SSI prophylaxis. (G8918)  

## 2012-12-03 NOTE — Op Note (Signed)
[facility] [address] [place], [address]   COLONOSCOPY PROCEDURE REPORT  PATIENT: Sherry, Chavez  MR#: [id number] BIRTHDATE: 1981-07-21 , 31  yrs. old GENDER: Female ENDOSCOPIST: Meryl Dare, MD, [facility] PROCEDURE DATE:  12/03/2012 PROCEDURE:   Colonoscopy with biopsy ASA CLASS:   Class II INDICATIONS:Unexplained diarrhea and hematochezia. MEDICATIONS: MAC sedation, administered by CRNA and propofol (Diprivan) 300mg  IV DESCRIPTION OF PROCEDURE:   After the risks benefits and alternatives of the procedure were thoroughly explained, informed consent was obtained.  A digital rectal exam revealed no abnormalities of the rectum.   The LB [id number]  endoscope was introduced through the anus and advanced to the cecum, which was identified by both the appendix and ileocecal valve. No adverse events experienced.   The quality of the prep was excellent, using MoviPrep  The instrument was then slowly withdrawn as the colon was fully examined.  COLON FINDINGS: Abnormal mucosa was found in the rectum and sigmoid colon.  The mucosa was erythematous and had granularity and erosions. Multiple biopsies of the area were performed using cold forceps. The colon was otherwise normal. There was no diverticulosis, inflammation, polyps or cancers unless previously stated. Multiple biopsies of the area were performed using cold forceps. Several attempts were made to intubate the TI but I was not able to enter the TI. Retroflexed views revealed no abnormalities. The time to cecum=2 minutes 51 seconds.  Withdrawal time=11 minutes 05 seconds.  The scope was withdrawn and the procedure completed.  COMPLICATIONS: There were no complications.  ENDOSCOPIC IMPRESSION: 1.   Abnormal mucosa in the rectum and sigmoid colon; multiple biopsies were performed using cold forceps 2.   The colon was otherwise normal  RECOMMENDATIONS: 1.  Await pathology results 2.  Call office  for follow-up appointment in 2 weeks  eSigned:  Meryl Dare, MD, [facility] 12/03/2012 11:20 AM

## 2012-12-04 ENCOUNTER — Telehealth: Payer: Self-pay | Admitting: *Deleted

## 2012-12-04 NOTE — Telephone Encounter (Signed)
Left message on number given in admitting.ewm 

## 2012-12-10 ENCOUNTER — Encounter: Payer: Self-pay | Admitting: Gastroenterology

## 2012-12-26 ENCOUNTER — Ambulatory Visit: Payer: 59 | Admitting: Gastroenterology

## 2013-01-19 ENCOUNTER — Other Ambulatory Visit: Payer: Self-pay

## 2013-09-11 ENCOUNTER — Encounter: Payer: Self-pay | Admitting: Gynecology

## 2013-10-24 ENCOUNTER — Ambulatory Visit: Payer: Self-pay | Admitting: Gynecology

## 2013-10-25 ENCOUNTER — Ambulatory Visit: Payer: Self-pay | Admitting: Gynecology

## 2013-11-26 ENCOUNTER — Encounter: Payer: Self-pay | Admitting: Gynecology

## 2013-11-27 ENCOUNTER — Ambulatory Visit: Payer: Self-pay | Admitting: Gynecology

## 2014-01-03 ENCOUNTER — Encounter: Payer: Self-pay | Admitting: Gynecology

## 2014-01-03 ENCOUNTER — Ambulatory Visit (INDEPENDENT_AMBULATORY_CARE_PROVIDER_SITE_OTHER): Payer: 59 | Admitting: Gynecology

## 2014-01-03 VITALS — BP 104/68 | HR 66 | Resp 12 | Ht 65.5 in | Wt 125.0 lb

## 2014-01-03 DIAGNOSIS — Z01419 Encounter for gynecological examination (general) (routine) without abnormal findings: Secondary | ICD-10-CM

## 2014-01-03 DIAGNOSIS — K589 Irritable bowel syndrome without diarrhea: Secondary | ICD-10-CM

## 2014-01-03 DIAGNOSIS — Z0000 Encounter for general adult medical examination without abnormal findings: Secondary | ICD-10-CM

## 2014-01-03 DIAGNOSIS — N912 Amenorrhea, unspecified: Secondary | ICD-10-CM

## 2014-01-03 LAB — POCT URINALYSIS DIPSTICK
LEUKOCYTES UA: NEGATIVE
PH UA: 5
Urobilinogen, UA: NEGATIVE

## 2014-01-03 NOTE — Progress Notes (Signed)
33 y.o. married Caucasian female  [phone] here for annual exam. Pt is currently sexually active.  Pt stopped breast feeding about 1y ago and her cycles have returned normal.  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 No dyspareunia.  Patient's last menstrual period was 12/23/2013.          Sexually active: yes  The current method of family planning is Husband has vasectomy.    Exercising: yes  running/elipitcal 4x/wk; weight lifiting 2x/wk Last pap: 10/22/12 NEG HR HPV Alcohol: 1 Glass of wine/wk Tobacco: no BSE: yes  Urine: Negative    Health Maintenance  Topic Date Due  . Influenza Vaccine  07/05/2013  . Pap Smear  10/23/2015  . Tetanus/tdap  12/05/2016    Family History  Problem Relation Age of Onset  . Ulcerative colitis Paternal Grandmother   . Diabetes Maternal Grandmother   . Irritable bowel syndrome Father   . Hypertension Mother   . Spina bifida Sister     Patient Active Problem List   Diagnosis Date Noted  . Colitis 11/04/2012    Past Medical History  Diagnosis Date  . GERD (gastroesophageal reflux disease) 2003  . IBS (irritable bowel syndrome)   . Colitis 11/2012    Past Surgical History  Procedure Laterality Date  . Tonsillectomy and adenoidectomy  1998; Age 33  . Dilation and curettage of uterus  06/2007    Allergies: Codeine  Current Outpatient Prescriptions  Medication Sig Dispense Refill  . cholecalciferol (VITAMIN D) 1000 UNITS tablet Take 1,000 Units by mouth daily.      . Multiple Vitamins-Minerals (MULTIVITAMIN PO) Take by mouth.      . Omega-3 Fatty Acids (FISH OIL PO) Take by mouth.      . Probiotic Product (PROBIOTIC PO) Take by mouth.       No current facility-administered medications for this visit.    ROS: Pertinent items are noted in HPI.  Exam:    BP 104/68  Pulse 66  Resp 12  Ht 5' 5.5" (1.664 m)  Wt 125 lb (56.7 kg)  BMI 20.48 kg/m2  LMP 12/23/2013 Weight change: @WEIGHTCHANGE @ Last 3 height recordings:  Ht Readings  from Last 3 Encounters:  01/03/14 5' 5.5" (1.664 m)  12/03/12 5\' 4"  (1.626 m)  11/13/12 5' 4.75" (1.645 m)   General appearance: alert, cooperative and appears stated age Head: Normocephalic, without obvious abnormality, atraumatic Neck: no adenopathy, no carotid bruit, no JVD, supple, symmetrical, trachea midline and thyroid not enlarged, symmetric, no tenderness/mass/nodules Lungs: clear to auscultation bilaterally Breasts: normal appearance, no masses or tenderness Heart: regular rate and rhythm, S1, S2 normal, no murmur, click, rub or gallop Abdomen: soft, non-tender; bowel sounds normal; no masses,  no organomegaly Extremities: extremities normal, atraumatic, no cyanosis or edema Skin: Skin color, texture, turgor normal. No rashes or lesions Lymph nodes: Cervical, supraclavicular, and axillary nodes normal. no inguinal nodes palpated Neurologic: Grossly normal   Pelvic: External genitalia:  no lesions              Urethra: normal appearing urethra with no masses, tenderness or lesions              Bartholins and Skenes: normal                 Vagina: normal appearing vagina with normal color and discharge, no lesions              Cervix: normal appearance  Pap taken: no        Bimanual Exam:  Uterus:  uterus is normal size, shape, consistency and nontender                                      Adnexa:    normal adnexa in size, nontender and no masses                                      Rectovaginal: Confirms                                      Anus:  normal sphincter tone, no lesions  A: well woman Contraceptive management     P: BSE stressed pap smear n ot done today  counseled on breast self exam, adequate intake of calcium and vitamin D, diet and exercise return annually or prn   An After Visit Summary was printed and given to the patient.

## 2014-01-03 NOTE — Patient Instructions (Signed)

## 2014-05-26 ENCOUNTER — Ambulatory Visit (INDEPENDENT_AMBULATORY_CARE_PROVIDER_SITE_OTHER): Payer: 59 | Admitting: Gynecology

## 2014-05-26 ENCOUNTER — Telehealth: Payer: Self-pay | Admitting: Gynecology

## 2014-05-26 VITALS — BP 106/66 | Resp 12 | Ht 65.5 in | Wt 128.0 lb

## 2014-05-26 DIAGNOSIS — R141 Gas pain: Secondary | ICD-10-CM

## 2014-05-26 DIAGNOSIS — R140 Abdominal distension (gaseous): Secondary | ICD-10-CM

## 2014-05-26 DIAGNOSIS — R142 Eructation: Secondary | ICD-10-CM

## 2014-05-26 DIAGNOSIS — N6452 Nipple discharge: Secondary | ICD-10-CM

## 2014-05-26 DIAGNOSIS — N6459 Other signs and symptoms in breast: Secondary | ICD-10-CM

## 2014-05-26 DIAGNOSIS — R5381 Other malaise: Secondary | ICD-10-CM

## 2014-05-26 DIAGNOSIS — R143 Flatulence: Secondary | ICD-10-CM

## 2014-05-26 DIAGNOSIS — R5383 Other fatigue: Secondary | ICD-10-CM

## 2014-05-26 LAB — CBC
HEMATOCRIT: 41.3 % (ref 36.0–46.0)
HEMOGLOBIN: 13.7 g/dL (ref 12.0–15.0)
MCH: 28.8 pg (ref 26.0–34.0)
MCHC: 33.2 g/dL (ref 30.0–36.0)
MCV: 86.8 fL (ref 78.0–100.0)
Platelets: 287 10*3/uL (ref 150–400)
RBC: 4.76 MIL/uL (ref 3.87–5.11)
RDW: 13.3 % (ref 11.5–15.5)
WBC: 6.6 10*3/uL (ref 4.0–10.5)

## 2014-05-26 LAB — TSH: TSH: 1.194 u[IU]/mL (ref 0.350–4.500)

## 2014-05-26 NOTE — Telephone Encounter (Signed)
Patient says she has noticed some changes in her breast. Patient would like an appointment with Dr.Lathrop.

## 2014-05-26 NOTE — Progress Notes (Signed)
Subjective:     Patient ID: Sherry Chavez, female   DOB: 06-08-1981, 33 y.o.   MRN: [id number]  HPI Comments: Pt reports right nipple discharge that she noticed during monthly exam, straw colored, non-tender, small amount.  No unprovoked discharge since. In addition, she reports feeling bloating, crampy, dizziness and lack of libido and fatigue, moody and emotional lability..   Pt has been training for 1/2 marathon and is running 78m/wk.  Pt reports delay in recovery after running.  Pt runs 4.5 3x/w, and weekend 7-33m. Increase in running over last month.  Pt wears a tight running bra, no nipple irritation.  Pt is sleeping well. LMP 6/1- on time, very light, shorter.      Review of Systems     Objective:   Physical Exam  Nursing note and vitals reviewed. Constitutional: She appears well-developed and well-nourished.  Pulmonary/Chest: Right breast exhibits nipple discharge (milk, white, scant, expressed). Right breast exhibits no inverted nipple, no mass, no skin change and no tenderness. Left breast exhibits no inverted nipple, no mass, no nipple discharge, no skin change and no tenderness.  Abdominal: Soft. There is no tenderness. There is no rebound and no guarding.   Pelvic: External genitalia:  no lesions              Urethra:  normal appearing urethra with no masses, tenderness or lesions              Bartholins and Skenes: normal                 Vagina: normal appearing vagina with normal color and discharge, no lesions              Cervix: normal appearance                      Bimanual Exam:  Uterus:  uterus is normal size, shape, consistency and nontender                                      Adnexa: normal adnexa in size, nontender and no masses                                      Rectovaginal: Confirms                                      Anus:  normal sphincter tone, no lesions      Assessment:     Nipple discharge Bloating cramping IBS Menstrual change      Plan:     PRL, TSH Watch upcoming cycle, if not return to normal will proceed with PUS, exam today normal Relationship between changes in cycle with change in menses reviewed U/a, cuture

## 2014-05-26 NOTE — Telephone Encounter (Signed)
Spoke with patient. Patient states that she has been training for a half marathon for 4-5 months. About one to two weeks ago patient states that she became very fatigued, dizzy, and nauseated daily. While this has gone away patient states that while performing self breast exam last week  "There are clear fluids coming from one of my breast ducts. I don't know if it is from all the exercise or what?" Patient would like to come in to be seen by Dr.Lathrop. Appointment scheduled for today at 1300 with Dr.Lathrop. Patient agreeable to date and time.   Routing to provider for final review. Patient agreeable to disposition. Will close encounter

## 2014-05-27 ENCOUNTER — Telehealth: Payer: Self-pay | Admitting: Gynecology

## 2014-05-27 DIAGNOSIS — N6452 Nipple discharge: Secondary | ICD-10-CM

## 2014-05-27 LAB — URINALYSIS, MICROSCOPIC ONLY
BACTERIA UA: NONE SEEN
CRYSTALS: NONE SEEN
Casts: NONE SEEN
SQUAMOUS EPITHELIAL / LPF: NONE SEEN

## 2014-05-27 LAB — PROLACTIN: Prolactin: 7.8 ng/mL

## 2014-05-27 LAB — URINE CULTURE
Colony Count: NO GROWTH
Organism ID, Bacteria: NO GROWTH

## 2014-05-28 NOTE — Telephone Encounter (Signed)
Informed labs all normal, TSH, PRL, cbc. Milk only expressed at ov, recommend mammo and u/s of breast, pt agreeable, orders dropped

## 2014-05-28 NOTE — Telephone Encounter (Signed)
Lm on machine to call back, no name provided on voicemail, phone number confirmed

## 2014-05-28 NOTE — Telephone Encounter (Signed)
Returning a call to Dr.Lathrop.

## 2014-05-29 ENCOUNTER — Other Ambulatory Visit: Payer: Self-pay | Admitting: Gynecology

## 2014-05-29 DIAGNOSIS — N6452 Nipple discharge: Secondary | ICD-10-CM

## 2014-05-29 NOTE — Telephone Encounter (Signed)
Patient is scheduled for bilateral dx mammogram with R breast Ultrasound and Galactogram of R Breast.  Scheduled for 06/10/14 at 1415.  Patient notified and agreeable to time/date/location. Will follow up prn.  Mammogram hold.

## 2014-06-10 ENCOUNTER — Ambulatory Visit
Admission: RE | Admit: 2014-06-10 | Discharge: 2014-06-10 | Disposition: A | Discharge status: Home / Self Care | Payer: 59 | Source: Ambulatory Visit | Attending: Gynecology | Admitting: Gynecology

## 2014-06-10 DIAGNOSIS — N6452 Nipple discharge: Secondary | ICD-10-CM

## 2014-06-18 ENCOUNTER — Ambulatory Visit (INDEPENDENT_AMBULATORY_CARE_PROVIDER_SITE_OTHER): Payer: 59 | Admitting: Gastroenterology

## 2014-06-18 ENCOUNTER — Encounter: Payer: Self-pay | Admitting: Gastroenterology

## 2014-06-18 VITALS — BP 100/60 | HR 68 | Ht 65.5 in | Wt 126.0 lb

## 2014-06-18 DIAGNOSIS — R109 Unspecified abdominal pain: Secondary | ICD-10-CM

## 2014-06-18 DIAGNOSIS — R197 Diarrhea, unspecified: Secondary | ICD-10-CM

## 2014-06-18 MED ORDER — GLYCOPYRROLATE 1 MG PO TABS: 1.0000 mg | ORAL_TABLET | Freq: Two times a day (BID) | ORAL | Status: DC

## 2014-06-18 NOTE — Progress Notes (Signed)
    History of Present Illness: This is a 33 year old female developed acute symptoms of nausea, right-sided abdominal pain and bloating associated with diarrhea and back pain several weeks ago. Symptoms have substantially improved with time but she has persistent problems with bloating across her upper abdomen primarily in the epigastrium and right upper quadrant. She describes the area as feeling tight. She notes a dull pain that radiates around to her mid and upper back as well. She has noted right-sided headaches frequently as well. Recent blood work by her GYN was unremarkable. She is having about 3 loose stools per day without bleeding. She underwent colonoscopy in 2013 and that was unremarkable. She notes her symptoms are not general at present first thing in the morning but tend to worsen after she eats and her symptoms tend to be bothersome the rest of the day.  Current Medications, Allergies, Past Medical History, Past Surgical History, Family History and Social History were reviewed in [employer] record.  Physical Exam: General: Well developed , well nourished, no acute distress Head: Normocephalic and atraumatic Eyes:  sclerae anicteric, EOMI Ears: Normal auditory acuity Mouth: No deformity or lesions Lungs: Clear throughout to auscultation Heart: Regular rate and rhythm; no murmurs, rubs or bruits Abdomen: Soft, non tender and non distended. No masses, hepatosplenomegaly or hernias noted. Normal Bowel sounds Musculoskeletal: Symmetrical with no gross deformities  Pulses:  Normal pulses noted Extremities: No clubbing, cyanosis, edema or deformities noted Neurological: Alert oriented x 4, grossly nonfocal Psychological:  Alert and cooperative. Normal mood and affect  Assessment and Recommendations:  1. Upper abdominal pain and bloating, back pain, diarrhea. Suspected IBS. Trial of glycopyrrolate 1 mg twice a day. If symptoms do not adequately respond consider  further evaluation with CMET, lipase, tTG, IgA. Also consider abdominal ultrasound and upper endoscopy. Return office visit in 4 weeks.

## 2014-06-18 NOTE — Patient Instructions (Signed)
We have sent the following medications to your pharmacy for you to pick up at your convenience: Robinul.  Please follow up with Dr. Fuller Plan on 07/28/14 at 9:15am. If you need to reschedule to cancel,please call [phone].  Thank you for choosing me and [facility].  Pricilla Riffle. Dagoberto Ligas., MD., Marval Regal

## 2014-06-23 ENCOUNTER — Encounter: Payer: Self-pay | Admitting: Gastroenterology

## 2014-06-23 MED ORDER — CIPROFLOXACIN HCL 500 MG PO TABS: 500.0000 mg | ORAL_TABLET | Freq: Two times a day (BID) | ORAL | Status: DC

## 2014-06-23 MED ORDER — RIFAXIMIN 550 MG PO TABS: 550.0000 mg | ORAL_TABLET | Freq: Two times a day (BID) | ORAL | Status: DC

## 2014-06-23 NOTE — Telephone Encounter (Signed)
Per Dr. Fuller Plan change to xifaxan 550 mg 1 po bid for 7 days

## 2014-06-23 NOTE — Telephone Encounter (Signed)
Per Dr. Fuller Plan if still having symptoms call in cipro 500 mg BID x 10 days.

## 2014-06-23 NOTE — Addendum Note (Signed)
Addended by: Marlon Pel on: 06/23/2014 01:05 PM   Modules accepted: Orders

## 2014-06-30 ENCOUNTER — Encounter: Payer: Self-pay | Admitting: Gynecology

## 2014-07-28 ENCOUNTER — Ambulatory Visit: Payer: 59 | Admitting: Gastroenterology

## 2014-07-29 ENCOUNTER — Encounter: Payer: Self-pay | Admitting: Gynecology

## 2014-09-19 ENCOUNTER — Other Ambulatory Visit: Payer: Self-pay

## 2014-10-06 ENCOUNTER — Encounter: Payer: Self-pay | Admitting: Gastroenterology

## 2014-11-12 ENCOUNTER — Telehealth: Payer: Self-pay | Admitting: Emergency Medicine

## 2014-11-12 NOTE — Telephone Encounter (Signed)
-----   Message from Elveria Rising, MD sent at 09/15/2014  8:12 AM EDT ----- Regarding: RE: Mammogram hold  yes ----- Message -----    From: Karen Chafe, RN    Sent: 09/08/2014   3:30 PM      To: Azalia Bilis, MD Subject: Mammogram hold                                 Okay to remove from mammogram hold?

## 2015-01-09 ENCOUNTER — Encounter: Payer: Self-pay | Admitting: Nurse Practitioner

## 2015-01-09 ENCOUNTER — Ambulatory Visit (INDEPENDENT_AMBULATORY_CARE_PROVIDER_SITE_OTHER): Payer: Managed Care, Other (non HMO) | Admitting: Nurse Practitioner

## 2015-01-09 VITALS — BP 100/62 | HR 76 | Temp 98.7°F | Ht 65.5 in | Wt 132.0 lb

## 2015-01-09 DIAGNOSIS — R350 Frequency of micturition: Secondary | ICD-10-CM

## 2015-01-09 LAB — POCT URINALYSIS DIPSTICK
BILIRUBIN UA: NEGATIVE
Blood, UA: NEGATIVE
Glucose, UA: NEGATIVE
KETONES UA: NEGATIVE
LEUKOCYTES UA: NEGATIVE
Nitrite, UA: NEGATIVE
PROTEIN UA: NEGATIVE
Urobilinogen, UA: NEGATIVE
pH, UA: 7

## 2015-01-09 MED ORDER — NITROFURANTOIN MONOHYD MACRO 100 MG PO CAPS: 100.0000 mg | ORAL_CAPSULE | Freq: Two times a day (BID) | ORAL | Status: DC

## 2015-01-09 NOTE — Patient Instructions (Signed)

## 2015-01-09 NOTE — Progress Notes (Signed)
S:  34 y.o.Married Caucasian female presents with complaint of UTI. Symptoms began on  01/08/15. With symptoms of lower pelvic pressure across the bladder.   Pertinent negatives include The patient is having no constitutional symptoms, denying fever, chills, anorexia, or weight loss. Sexually active yes.  Symptoms not related to post coital. Current method of birth control husband with vasectomy.  Same partner without change. Last UTI when she was pregnant several years ago.  Some bladder spasm. Denies vaginal discharge.  LMP 01/03/15.  ROS: no weight loss, fever, night sweats and feels well  O alert, oriented to person, place, and time   thin, healthy,  alert and  not in acute distress  Abdomen: mild tenderness over suprapubic  No CVA tenderness  pelvic: deferred   Diagnostic:    Urinalysis  - chemstrip is negative - forcing fluids today   urine culture  Assessment: R/O UTI  Plan:   Maintain adequate hydration. Follow up if symptoms not improving, and as needed.   Medication Therapy: Macrobid 100 mg BID # 14   Call with urine culture      RV

## 2015-01-11 LAB — URINE CULTURE: Colony Count: 30000

## 2015-01-11 NOTE — Progress Notes (Signed)
Encounter reviewed by Dr. Jennfier Abdulla Silva.  

## 2015-01-12 ENCOUNTER — Ambulatory Visit (INDEPENDENT_AMBULATORY_CARE_PROVIDER_SITE_OTHER): Payer: Managed Care, Other (non HMO) | Admitting: Certified Nurse Midwife

## 2015-01-12 ENCOUNTER — Encounter: Payer: Self-pay | Admitting: Certified Nurse Midwife

## 2015-01-12 VITALS — BP 110/70 | HR 70 | Temp 98.3°F | Resp 16 | Ht 65.5 in | Wt 129.0 lb

## 2015-01-12 DIAGNOSIS — N390 Urinary tract infection, site not specified: Secondary | ICD-10-CM

## 2015-01-12 LAB — POCT URINALYSIS DIPSTICK
Bilirubin, UA: NEGATIVE
Blood, UA: NEGATIVE
GLUCOSE UA: NEGATIVE
Ketones, UA: NEGATIVE
LEUKOCYTES UA: NEGATIVE
NITRITE UA: NEGATIVE
Protein, UA: NEGATIVE
Urobilinogen, UA: NEGATIVE
pH, UA: 5

## 2015-01-12 LAB — CBC WITH DIFFERENTIAL/PLATELET
BASOS PCT: 0 % (ref 0–1)
Basophils Absolute: 0 10*3/uL (ref 0.0–0.1)
EOS PCT: 0 % (ref 0–5)
Eosinophils Absolute: 0 10*3/uL (ref 0.0–0.7)
HEMATOCRIT: 44.1 % (ref 36.0–46.0)
Hemoglobin: 14.4 g/dL (ref 12.0–15.0)
LYMPHS ABS: 1.8 10*3/uL (ref 0.7–4.0)
LYMPHS PCT: 30 % (ref 12–46)
MCH: 29 pg (ref 26.0–34.0)
MCHC: 32.7 g/dL (ref 30.0–36.0)
MCV: 88.9 fL (ref 78.0–100.0)
MONO ABS: 0.4 10*3/uL (ref 0.1–1.0)
MONOS PCT: 7 % (ref 3–12)
MPV: 9.8 fL (ref 8.6–12.4)
NEUTROS ABS: 3.8 10*3/uL (ref 1.7–7.7)
Neutrophils Relative %: 63 % (ref 43–77)
Platelets: 312 10*3/uL (ref 150–400)
RBC: 4.96 MIL/uL (ref 3.87–5.11)
RDW: 13.3 % (ref 11.5–15.5)
WBC: 6.1 10*3/uL (ref 4.0–10.5)

## 2015-01-12 MED ORDER — PHENAZOPYRIDINE HCL 200 MG PO TABS: 200.0000 mg | ORAL_TABLET | Freq: Three times a day (TID) | ORAL | Status: DC | PRN

## 2015-01-12 NOTE — Patient Instructions (Signed)

## 2015-01-12 NOTE — Progress Notes (Signed)
34 y.o. Married Caucasian female [phone] here for follow up of UTI treated with Macrobid initiated on 01/19/15. Patient was given Macrobid, but stopped after she was told her culture is negative. Patient continues with pressure in bladder area and some nausea. Denies frequency, urgency or pain with urination. Continues to feel pressure. Denies increase in vaginal discharge or STD concerns. LMP 01/03/15 which is normal. Contraception  Spouse vasectomy.    O: Healthy WD,WN female Affect: normal, orientation x 3 Skin:warm and dry CVAT negative bilateral Abdomen:soft , normal bowel sounds, positive suprapubic tenderness, no other point pain noted, no rebound tenderness Pelvic exam:EXTERNAL GENITALIA: normal appearing vulva with no masses, tenderness or lesions Bladder, Urethra meatus and urethra all tender. VAGINA: no abnormal discharge or lesions, ph 4.0 and wet prep taken CERVIX: no lesions or cervical motion tenderness, normal appearance Uterus:Non tender,normal size ADNEXA: no masses palpable and nontender  Wet prep negative  A: Normal pelvic exam UTI has stopped treatment, still symptomatic Urine culture multibacteria, no specific pathogen Wet prep negative  P: Discussed findings of normal pelvic exam findings. Discussed needs to continue Macrobid due to symptoms until completed.Discussed will give Rx for pressure which should help.Encouraged to increase water intake, warning signs of UTI given and need to advise if not resolved. Discussed importance of healthy diet also. Rx Pyridium see order   Labs CBC with diff  RV prn

## 2015-01-14 NOTE — Progress Notes (Signed)
Reviewed personally.  M. Suzanne Allen Basista, MD.  

## 2015-01-15 ENCOUNTER — Ambulatory Visit: Payer: Self-pay | Admitting: Gastroenterology

## 2015-01-16 ENCOUNTER — Ambulatory Visit (INDEPENDENT_AMBULATORY_CARE_PROVIDER_SITE_OTHER): Payer: Managed Care, Other (non HMO) | Admitting: Certified Nurse Midwife

## 2015-01-16 ENCOUNTER — Encounter: Payer: Self-pay | Admitting: Certified Nurse Midwife

## 2015-01-16 ENCOUNTER — Ambulatory Visit: Payer: 59 | Admitting: Gynecology

## 2015-01-16 VITALS — BP 108/62 | HR 68 | Resp 16 | Ht 65.25 in | Wt 130.0 lb

## 2015-01-16 DIAGNOSIS — E049 Nontoxic goiter, unspecified: Secondary | ICD-10-CM

## 2015-01-16 DIAGNOSIS — N390 Urinary tract infection, site not specified: Secondary | ICD-10-CM

## 2015-01-16 DIAGNOSIS — Z124 Encounter for screening for malignant neoplasm of cervix: Secondary | ICD-10-CM

## 2015-01-16 DIAGNOSIS — Z01419 Encounter for gynecological examination (general) (routine) without abnormal findings: Secondary | ICD-10-CM

## 2015-01-16 LAB — TSH: TSH: 0.88 u[IU]/mL (ref 0.350–4.500)

## 2015-01-16 NOTE — Progress Notes (Signed)
Reviewed personally.  M. Suzanne Mika Griffitts, MD.  

## 2015-01-16 NOTE — Progress Notes (Signed)
34 y.o. [id number] Married  Caucasian Fe here for annual exam.  Periods normal. No issues. Feeling much better after completing Macrobid/ Pyridium. Patient still having slight sensitivity in bladder area, but no pain, urgency or frequency, fever or chills. Has increased water intake also. Sees PCP prn. No other health issues today.  Patient's last menstrual period was 01/03/2015 (exact date).          Sexually active: Yes.    The current method of family planning is vasectomy.    Exercising: Yes.    running Smoker:  no  Health Maintenance: Pap:  10-22-12 neg HPV HR neg MMG: 06-10-14 category c density,birads 1:neg Colonoscopy: 11-23-12 BMD:   none TDaP:  2008 Labs: none Self breast exam: done monthly   reports that she has never smoked. She has never used smokeless tobacco. She reports that she drinks about 1.2 oz of alcohol per week. She reports that she does not use illicit drugs.  Past Medical History  Diagnosis Date  . GERD (gastroesophageal reflux disease) 2003  . IBS (irritable bowel syndrome)     Past Surgical History  Procedure Laterality Date  . Tonsillectomy and adenoidectomy  1998; Age 58  . Dilation and curettage of uterus  06/2007    Current Outpatient Prescriptions  Medication Sig Dispense Refill  . cholecalciferol (VITAMIN D) 1000 UNITS tablet Take 1,000 Units by mouth daily.    . Multiple Vitamins-Minerals (MULTIVITAMIN PO) Take by mouth.    . Omega-3 Fatty Acids (FISH OIL PO) Take by mouth.    . Probiotic Product (PROBIOTIC PO) Take by mouth.     No current facility-administered medications for this visit.    Family History  Problem Relation Age of Onset  . Ulcerative colitis Paternal Grandmother   . Diabetes Maternal Grandmother   . Irritable bowel syndrome Father   . Hypertension Mother   . Spina bifida Sister     ROS:  Pertinent items are noted in HPI.  Otherwise, a comprehensive ROS was negative.  Exam:   BP 108/62 mmHg  Pulse 68  Resp 16  Ht 5'  5.25" (1.657 m)  Wt 130 lb (58.968 kg)  BMI 21.48 kg/m2  LMP 01/03/2015 (Exact Date) Height: 5' 5.25" (165.7 cm) Ht Readings from Last 3 Encounters:  01/16/15 5' 5.25" (1.657 m)  01/12/15 5' 5.5" (1.664 m)  01/09/15 5' 5.5" (1.664 m)    General appearance: alert, cooperative and appears stated age Head: Normocephalic, without obvious abnormality, atraumatic Neck: no adenopathy, supple, symmetrical, trachea midline and thyroid normal to inspection and palpation, enlarged and no nodules noted Lungs: clear to auscultation bilaterally CVAT negative bilateral Breasts: normal appearance, no masses or tenderness, No nipple retraction or dimpling, No nipple discharge or bleeding, No axillary or supraclavicular adenopathy Heart: regular rate and rhythm Abdomen: soft, non-tender; no masses,  no organomegaly, very slight suprapubic tenderness noted Extremities: extremities normal, atraumatic, no cyanosis or edema Skin: Skin color, texture, turgor normal. No rashes or lesions Lymph nodes: Cervical, supraclavicular, and axillary nodes normal. No abnormal inguinal nodes palpated Neurologic: Grossly normal   Pelvic: External genitalia:  no lesions, normal female              Urethra:  normal appearing urethra with no masses, tenderness or lesions non tender  Bladder or urethral meatus non tender              Bartholin's and Skene's: normal  Vagina: normal appearing vagina with normal color and discharge, no lesions              Cervix: normal, non tender, no lesions              Pap taken: Yes.   Bimanual Exam:  Uterus:  normal size, contour, position, consistency, mobility, non-tender              Adnexa: normal adnexa and no mass, fullness, tenderness               Rectovaginal: Confirms               Anus:  normal sphincter tone, no lesions  Chaperone present: Yes  A:  Well Woman with normal exam  Follow up UTI  Enlarged thyroid  P:   Reviewed health and wellness  pertinent to exam  Discussed starting on cranberry tablets daily and avoiding spicy foods which could be irritating bladder wall. Patient agreeable. Patient will advise if continues with sensitivity.  Lab urine culture  Discussed finding and etiology. Discussed no nodules noted. Patient has family history of thyroid issues with 2  Aunt. Denies fatigue or hair changes, but some dry skin issues. Questions addressed regarding thyroid issues.    Lab TSH  Pap smear taken today with reflex   counseled on breast self exam, adequate intake of calcium and vitamin D, diet and exercise  return annually or prn  An After Visit Summary was printed and given to the patient.

## 2015-01-16 NOTE — Patient Instructions (Signed)

## 2015-01-18 LAB — URINE CULTURE
COLONY COUNT: NO GROWTH
Organism ID, Bacteria: NO GROWTH

## 2015-01-19 LAB — IPS PAP TEST WITH REFLEX TO HPV

## 2015-01-21 ENCOUNTER — Ambulatory Visit (INDEPENDENT_AMBULATORY_CARE_PROVIDER_SITE_OTHER): Payer: Managed Care, Other (non HMO) | Admitting: Gastroenterology

## 2015-01-21 ENCOUNTER — Other Ambulatory Visit (INDEPENDENT_AMBULATORY_CARE_PROVIDER_SITE_OTHER): Payer: Managed Care, Other (non HMO)

## 2015-01-21 ENCOUNTER — Encounter: Payer: Self-pay | Admitting: Gastroenterology

## 2015-01-21 VITALS — BP 96/54 | HR 84 | Ht 64.75 in | Wt 130.2 lb

## 2015-01-21 DIAGNOSIS — R079 Chest pain, unspecified: Secondary | ICD-10-CM

## 2015-01-21 DIAGNOSIS — R1011 Right upper quadrant pain: Secondary | ICD-10-CM

## 2015-01-21 DIAGNOSIS — R197 Diarrhea, unspecified: Secondary | ICD-10-CM

## 2015-01-21 LAB — COMPREHENSIVE METABOLIC PANEL
ALT: 10 U/L (ref 0–35)
AST: 13 U/L (ref 0–37)
Albumin: 4.8 g/dL (ref 3.5–5.2)
Alkaline Phosphatase: 35 U/L — ABNORMAL LOW (ref 39–117)
BILIRUBIN TOTAL: 0.4 mg/dL (ref 0.2–1.2)
BUN: 11 mg/dL (ref 6–23)
CALCIUM: 9.9 mg/dL (ref 8.4–10.5)
CHLORIDE: 102 meq/L (ref 96–112)
CO2: 27 mEq/L (ref 19–32)
CREATININE: 0.66 mg/dL (ref 0.40–1.20)
GFR: 110.14 mL/min (ref >60.00)
GLUCOSE: 95 mg/dL (ref 70–99)
Potassium: 4.4 mEq/L (ref 3.5–5.1)
Sodium: 136 mEq/L (ref 135–145)
Total Protein: 7.6 g/dL (ref 6.0–8.3)

## 2015-01-21 LAB — IGA: IGA: 137 mg/dL (ref 68–378)

## 2015-01-21 MED ORDER — OMEPRAZOLE 20 MG PO CPDR: 20.0000 mg | DELAYED_RELEASE_CAPSULE | Freq: Every day | ORAL | Status: DC

## 2015-01-21 NOTE — Progress Notes (Signed)
History of Present Illness: This is a 34 year old female who has had persistent problems with urgent, loose, watery, nonbloody bowel movements. She typically notes the symptoms first thing in the morning and then has no bowel movements the rest of the day. She has developed worsening intermittent right upper quadrant pain with occasional pain between her shoulder blades. She is also developed intermittent nausea. She has no typical reflux symptoms denies epigastric pain, heartburn, regurgitation and anterior chest pain.  Allergies  Allergen Reactions  . Codeine Nausea And Vomiting and Other (See Comments)    Stomach pain   Outpatient Prescriptions Prior to Visit  Medication Sig Dispense Refill  . cholecalciferol (VITAMIN D) 1000 UNITS tablet Take 1,000 Units by mouth daily.    . Multiple Vitamins-Minerals (MULTIVITAMIN PO) Take 1 tablet by mouth daily.     . Omega-3 Fatty Acids (FISH OIL PO) Take 1 tablet by mouth daily.     . Probiotic Product (PROBIOTIC PO) Take 1 tablet by mouth daily.      No facility-administered medications prior to visit.   Past Medical History  Diagnosis Date  . GERD (gastroesophageal reflux disease) 2003  . IBS (irritable bowel syndrome)   . Enlarged thyroid    Past Surgical History  Procedure Laterality Date  . Tonsillectomy and adenoidectomy  1998; Age 33  . Dilation and curettage of uterus  06/2007   History   Social History  . Marital Status: Married    Spouse Name: N/A  . Number of Children: 3  . Years of Education: N/A   Occupational History  . home maker    Social History Main Topics  . Smoking status: Never Smoker   . Smokeless tobacco: Never Used  . Alcohol Use: 1.2 oz/week    2 Glasses of wine per week  . Drug Use: No  . Sexual Activity:    Partners: Male    Birth Control/ Protection: Surgical     Comment: Husband has Vasectomy   Other Topics Concern  . None   Social History Narrative   Family History  Problem Relation  Age of Onset  . Ulcerative colitis Paternal Grandmother   . Diabetes Maternal Grandmother   . Irritable bowel syndrome Father   . Hypertension Mother   . Spina bifida Sister       Physical Exam: General: Well developed , well nourished, no acute distress Head: Normocephalic and atraumatic Eyes:  sclerae anicteric, EOMI Ears: Normal auditory acuity Mouth: No deformity or lesions Lungs: Clear throughout to auscultation Heart: Regular rate and rhythm; no murmurs, rubs or bruits Abdomen: Soft, mild right upper quadrant tenderness to deep palpation without rebound or guarding and non distended. No masses, hepatosplenomegaly or hernias noted. Normal Bowel sounds Musculoskeletal: Symmetrical with no gross deformities  Pulses:  Normal pulses noted Extremities: No clubbing, cyanosis, edema or deformities noted Neurological: Alert oriented x 4, grossly nonfocal Psychological:  Alert and cooperative. Normal mood and affect  Assessment and Recommendations:  1. Right upper quadrant pain, back pain, nausea. Rule out cholelithiasis, GERD and ulcer disease. Schedule abdominal ultrasound and upper endoscopy. The risks (including bleeding, perforation, infection, missed lesions, medication reactions and possible hospitalization or surgery if complications occur), benefits, and alternatives to endoscopy with possible biopsy and possible dilation were discussed with the patient and they consent to proceed.  CMET, tTG and IgA. Begin standard antireflux measures and omeprazole 20 mg daily.  2. Diarrhea. Possible IBS. Previous trial of Robinul was not effective. Consider FODMAP diet  and a trial of another antispasmodic after above evaluation has been completed.

## 2015-01-21 NOTE — Patient Instructions (Signed)
Your physician has requested that you go to the basement for the following lab work before leaving today:Cmet, TTG, IGA.  We have sent the following medications to your pharmacy for you to pick up at your convenience:omeprazole.  You have been scheduled for an abdominal ultrasound at [facility] (1st floor of hospital) on 01/26/15 at 11:00am. Please arrive 15 minutes prior to your appointment for registration. Make certain not to have anything to eat or drink 6 hours prior to your appointment. Should you need to reschedule your appointment, please contact radiology at [phone]. This test typically takes about 30 minutes to perform.  You have been scheduled for an endoscopy. Please follow written instructions given to you at your visit today. If you use inhalers (even only as needed), please bring them with you on the day of your procedure.  Thank you for choosing me and [facility].  Pricilla Riffle. Dagoberto Ligas., MD., [facility] Regal

## 2015-01-22 LAB — TISSUE TRANSGLUTAMINASE, IGA: Tissue Transglutaminase Ab, IgA: 1 U/mL (ref <4)

## 2015-01-23 ENCOUNTER — Encounter: Payer: Self-pay | Admitting: Gastroenterology

## 2015-01-23 NOTE — Telephone Encounter (Signed)
Dr. Fuller Plan if she has been on a gluten free diet for 2 months would this effect the results of the lab work for celiac.  She is asking if you will biopsy for celiac at her EGD.  Should she return to eating gluten for the EGD?

## 2015-01-26 ENCOUNTER — Ambulatory Visit (HOSPITAL_COMMUNITY)
Admission: RE | Admit: 2015-01-26 | Discharge: 2015-01-26 | Disposition: A | Discharge status: Home / Self Care | Payer: Managed Care, Other (non HMO) | Source: Ambulatory Visit | Attending: Gastroenterology | Admitting: Gastroenterology

## 2015-01-26 ENCOUNTER — Other Ambulatory Visit: Payer: Self-pay

## 2015-01-26 DIAGNOSIS — R1011 Right upper quadrant pain: Secondary | ICD-10-CM | POA: Insufficient documentation

## 2015-01-26 DIAGNOSIS — R110 Nausea: Secondary | ICD-10-CM | POA: Diagnosis not present

## 2015-01-26 DIAGNOSIS — R197 Diarrhea, unspecified: Secondary | ICD-10-CM

## 2015-01-26 DIAGNOSIS — R079 Chest pain, unspecified: Secondary | ICD-10-CM

## 2015-02-03 ENCOUNTER — Encounter: Payer: Managed Care, Other (non HMO) | Admitting: Gastroenterology

## 2015-02-16 ENCOUNTER — Encounter: Payer: Self-pay | Admitting: Certified Nurse Midwife

## 2015-02-16 DIAGNOSIS — Z0189 Encounter for other specified special examinations: Secondary | ICD-10-CM

## 2015-09-22 IMAGING — US US ABDOMEN COMPLETE
1 series · 14 of 25 positions shown · non-contrast
Comparison: None.

CLINICAL DATA: Right upper quadrant pain x5 months, nausea

EXAM:
ULTRASOUND ABDOMEN COMPLETE

[Series 1: us abdomen complete · 0.18mm/px · 14 of 104 slices shown]
[im 1/104]
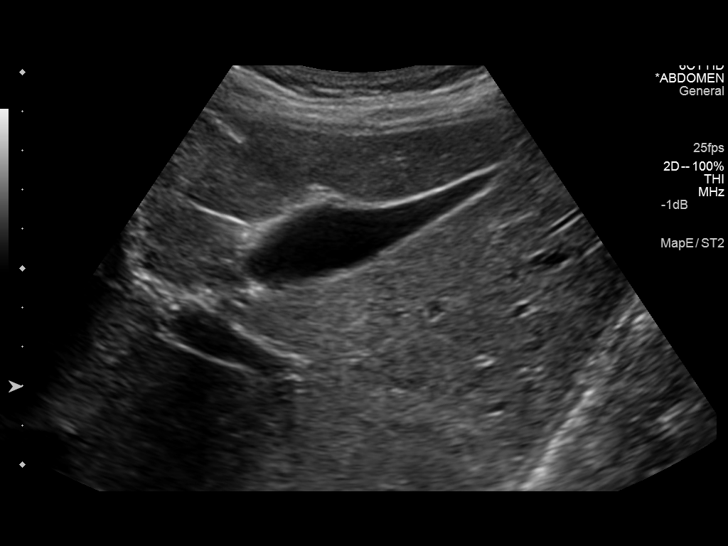
[im 9/104]
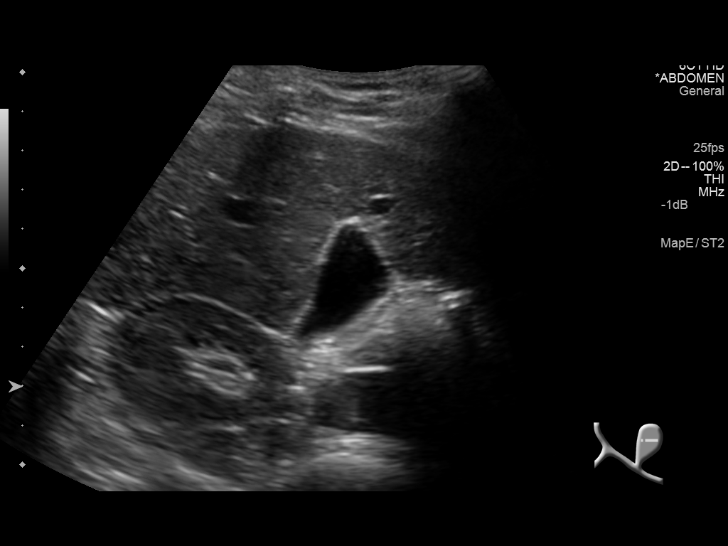
[im 18/104]
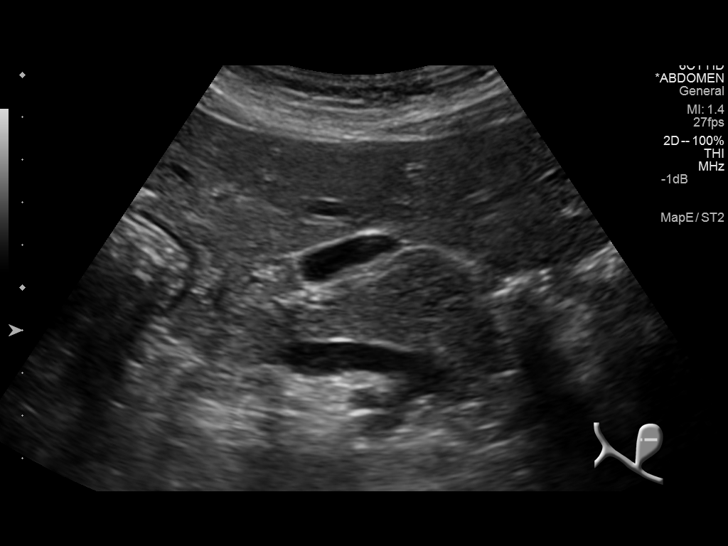
[im 26/104]
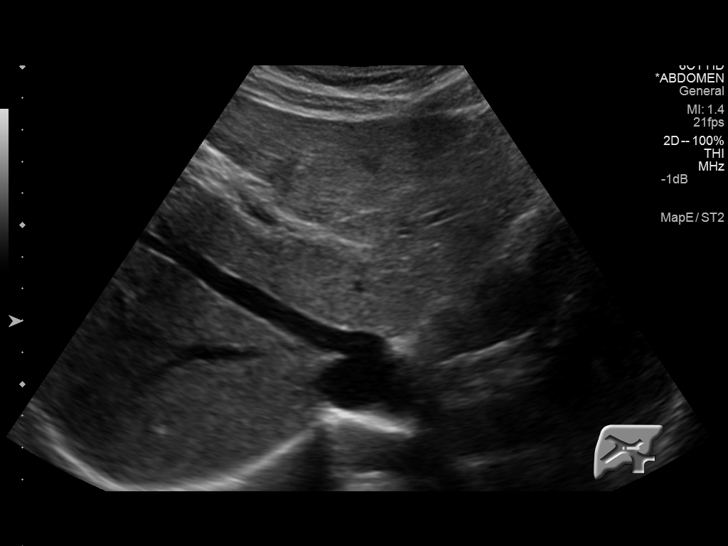
[im 35/104]
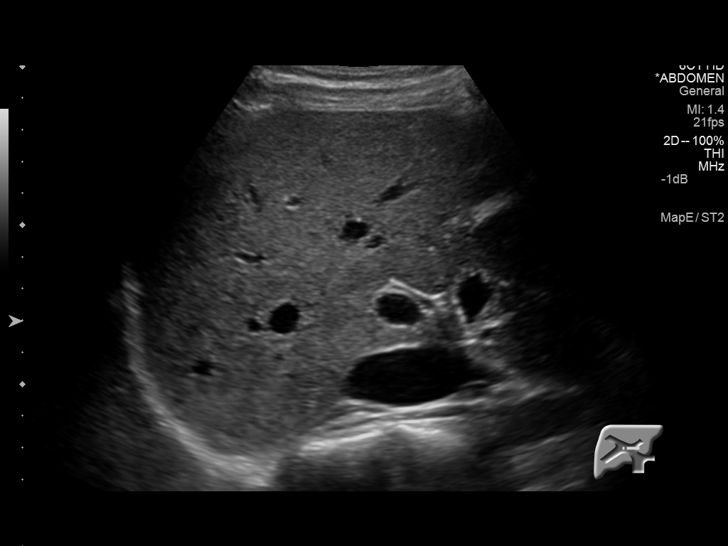
[im 39/104]
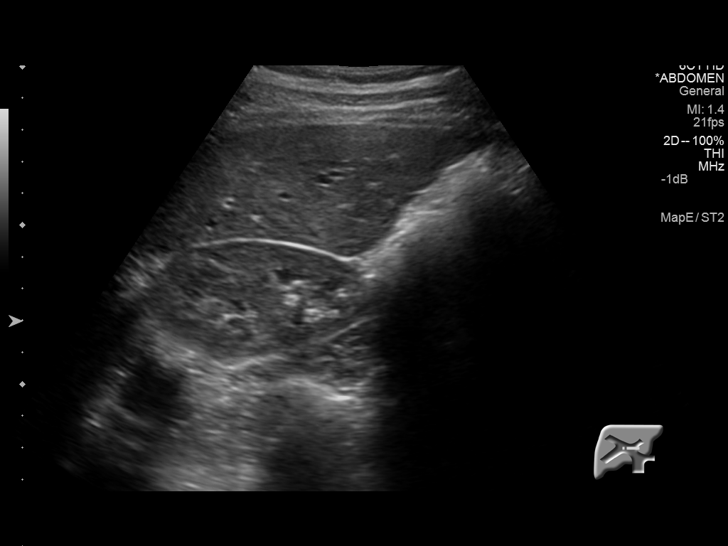
[im 48/104]
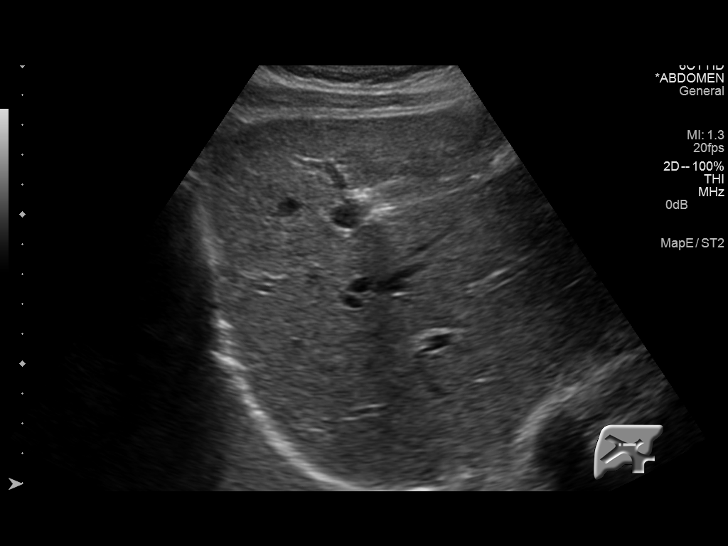
[im 56/104]
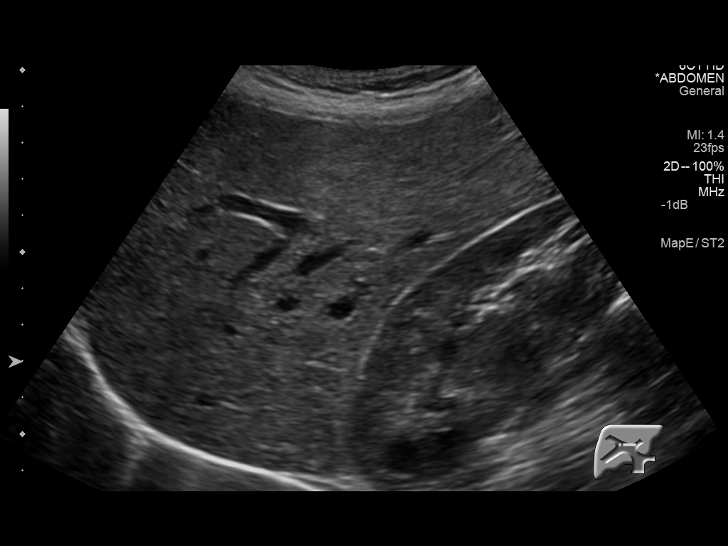
[im 65/104]
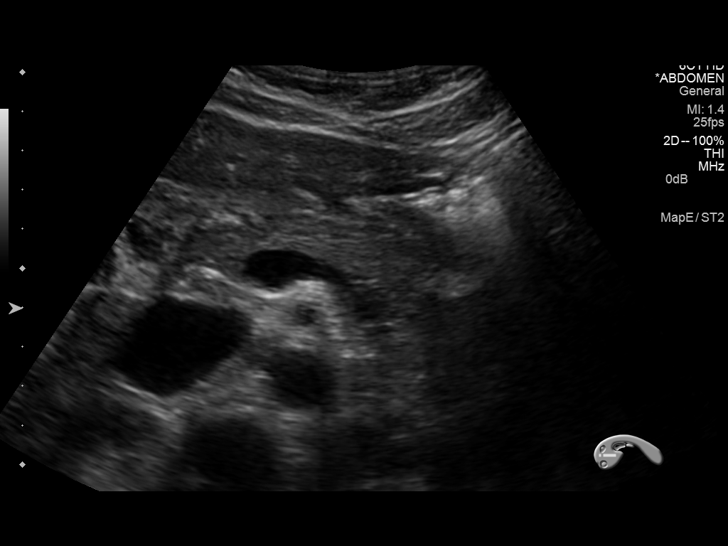
[im 69/104]
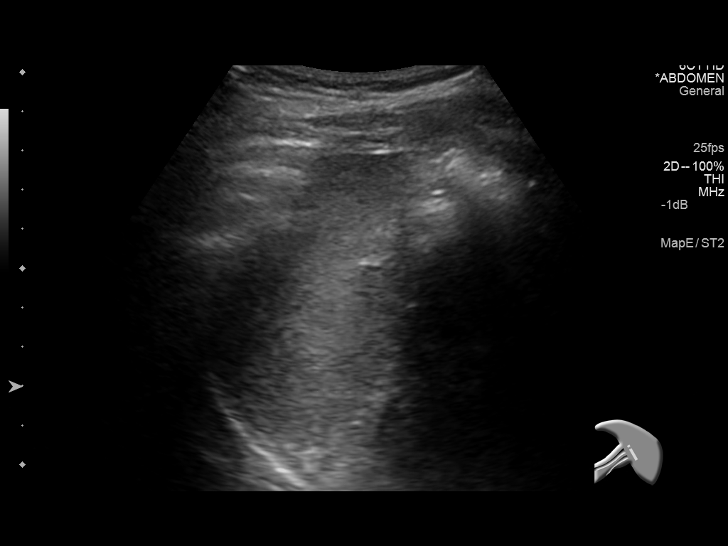
[im 78/104]
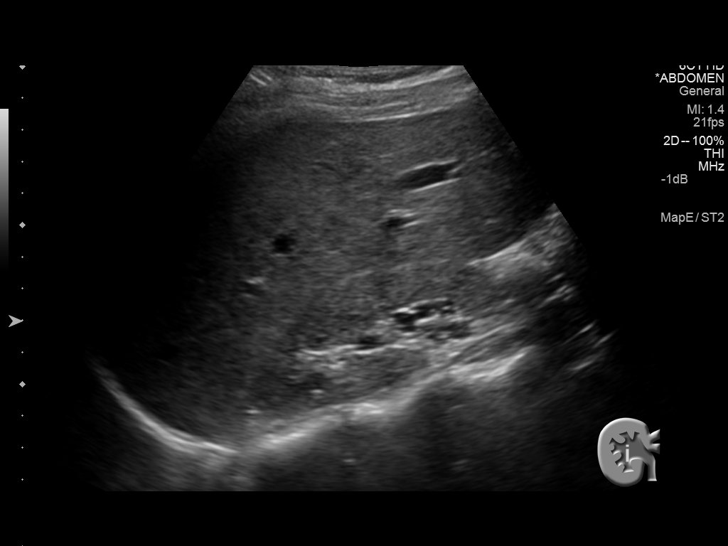
[im 86/104]
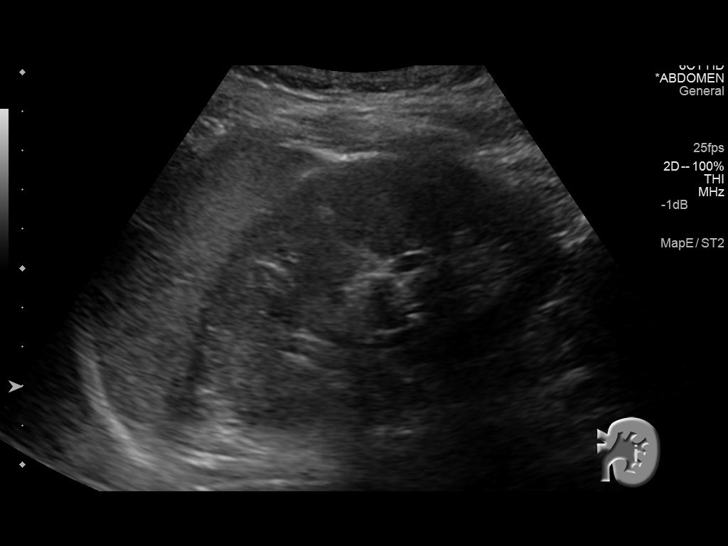
[im 95/104]
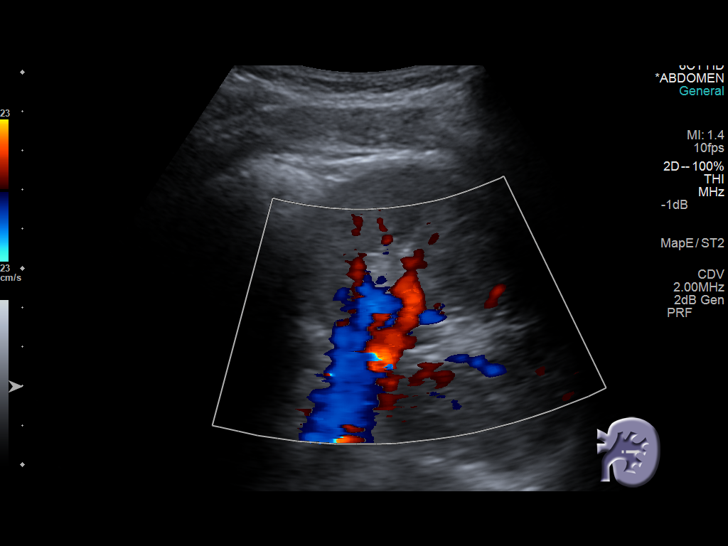
[im 104/104]
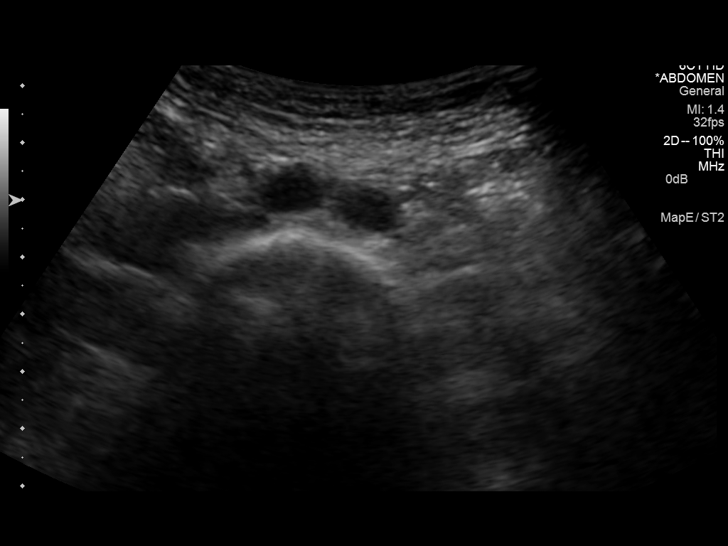

[14 of 25 positions shown; findings below may reference images not displayed]

FINDINGS: Gallbladder: No gallstones, gallbladder wall thickening, or
pericholecystic fluid. Negative sonographic Murphy's sign.

Common bile duct: Diameter: 3 mm

Liver: No focal lesion identified. Within normal limits in
parenchymal echogenicity.

IVC: No abnormality visualized.

Pancreas: Visualized portion unremarkable.

Spleen: Size and appearance within normal limits.

Right Kidney: Length: 10.6 cm.  No mass or hydronephrosis.

Left Kidney: Length: 11.1 cm.  No mass or hydronephrosis.

Abdominal aorta: No aneurysm visualized.

Other findings: None.
IMPRESSION: Negative right upper quadrant ultrasound.

## 2016-01-18 ENCOUNTER — Ambulatory Visit: Payer: Managed Care, Other (non HMO) | Admitting: Certified Nurse Midwife

## 2016-01-20 ENCOUNTER — Encounter: Payer: Self-pay | Admitting: Certified Nurse Midwife

## 2016-01-20 ENCOUNTER — Ambulatory Visit (INDEPENDENT_AMBULATORY_CARE_PROVIDER_SITE_OTHER): Payer: Managed Care, Other (non HMO) | Admitting: Certified Nurse Midwife

## 2016-01-20 VITALS — BP 100/62 | HR 68 | Resp 16 | Ht 64.75 in | Wt 133.0 lb

## 2016-01-20 DIAGNOSIS — Z0000 Encounter for general adult medical examination without abnormal findings: Secondary | ICD-10-CM | POA: Diagnosis not present

## 2016-01-20 DIAGNOSIS — Z01419 Encounter for gynecological examination (general) (routine) without abnormal findings: Secondary | ICD-10-CM | POA: Diagnosis not present

## 2016-01-20 DIAGNOSIS — E049 Nontoxic goiter, unspecified: Secondary | ICD-10-CM | POA: Insufficient documentation

## 2016-01-20 LAB — POCT URINALYSIS DIPSTICK
Bilirubin, UA: NEGATIVE
Blood, UA: NEGATIVE
GLUCOSE UA: NEGATIVE
Ketones, UA: NEGATIVE
Leukocytes, UA: NEGATIVE
Nitrite, UA: NEGATIVE
Protein, UA: NEGATIVE
UROBILINOGEN UA: NEGATIVE
pH, UA: 5

## 2016-01-20 LAB — THYROID PANEL WITH TSH
FREE THYROXINE INDEX: 2.3 (ref 1.4–3.8)
T3 Uptake: 30 % (ref 22–35)
T4, Total: 7.7 ug/dL (ref 4.5–12.0)
TSH: 1.16 mIU/L

## 2016-01-20 NOTE — Progress Notes (Signed)
35 y.o. [id number] Married  Caucasian Fe here for annual exam. Periods are still regular but 30- 32 day cycle, duration 5 days with heavy day 1-2. Some cramping no change. Still is able to note ovulation, now with some cramping. All changes over the last year. No health changes or weight changes. Desires screening,  LMP:01-12-16         Sexually active: Yes.    The current method of family planning is vasectomy.    Exercising: No.  running Smoker:  no  Health Maintenance: Pap:  01-16-15 neg MMG:  06-10-14 category c density,birads 1:neg Colonoscopy:  11-23-12 normal BMD:   none TDaP:  2008 Shingles: no Pneumonia: no Hep C and HIV: HIV neg 2012 Labs: poct urine-neg Self breast exam: done monthly   reports that she has never smoked. She has never used smokeless tobacco. She reports that she drinks about 1.2 oz of alcohol per week. She reports that she does not use illicit drugs.  Past Medical History  Diagnosis Date  . GERD (gastroesophageal reflux disease) 2003  . IBS (irritable bowel syndrome)   . Enlarged thyroid     Past Surgical History  Procedure Laterality Date  . Tonsillectomy and adenoidectomy  1998; Age 76  . Dilation and curettage of uterus  06/2007    Current Outpatient Prescriptions  Medication Sig Dispense Refill  . Cholecalciferol (VITAMIN D PO) Take 2,000 Int'l Units by mouth daily.    . Multiple Vitamins-Minerals (MULTIVITAMIN PO) Take 1 tablet by mouth daily.     . Omega-3 Fatty Acids (FISH OIL PO) Take 1 tablet by mouth daily.      No current facility-administered medications for this visit.    Family History  Problem Relation Age of Onset  . Ulcerative colitis Paternal Grandmother   . Diabetes Maternal Grandmother   . Irritable bowel syndrome Father   . Hypertension Mother   . Spina bifida Sister     ROS:  Pertinent items are noted in HPI.  Otherwise, a comprehensive ROS was negative.  Exam:   BP 100/62 mmHg  Pulse 68  Resp 16  Ht 5' 4.75" (1.645 m)   Wt 133 lb (60.328 kg)  BMI 22.29 kg/m2  LMP 01/12/2016 Height: 5' 4.75" (164.5 cm) Ht Readings from Last 3 Encounters:  01/20/16 5' 4.75" (1.645 m)  01/21/15 5' 4.75" (1.645 m)  01/16/15 5' 5.25" (1.657 m)    General appearance: alert, cooperative and appears stated age Head: Normocephalic, without obvious abnormality, atraumatic Neck: no adenopathy, supple, symmetrical, trachea midline and thyroid enlarged Lungs: clear to auscultation bilaterally Breasts: normal appearance, no masses or tenderness, No nipple retraction or dimpling, No nipple discharge or bleeding, No axillary or supraclavicular adenopathy Heart: regular rate and rhythm Abdomen: soft, non-tender; no masses,  no organomegaly Extremities: extremities normal, atraumatic, no cyanosis or edema Skin: Skin color, texture, turgor normal. No rashes or lesions Lymph nodes: Cervical, supraclavicular, and axillary nodes normal. No abnormal inguinal nodes palpated Neurologic: Grossly normal   Pelvic: External genitalia:  no lesions              Urethra:  normal appearing urethra with no masses, tenderness or lesions              Bartholin's and Skene's: normal                 Vagina: normal appearing vagina with normal color and discharge, no lesions  Cervix: no cervical motion tenderness, no lesions and normal appearance              Pap taken: No. Bimanual Exam:  Uterus:  normal size, contour, position, consistency, mobility, non-tender              Adnexa: normal adnexa and no mass, fullness, tenderness               Rectovaginal: Confirms               Anus:  normal sphincter tone, no lesions  Chaperone present: yes  A:  Well Woman with normal exam  Contraception spouse vasectomy  Cycle frequency change, but otherwise normal  Enlarged thyroid ? Nodule on right  Screening labs  P:   Reviewed health and wellness pertinent to exam  Patient will continue to keep menses record and advise if any other  changes such as spotting in between cycles or amount changes.   Discussed [place] evaluation of thyroid due to enlargement, patient agreeable. Discussed etiology of enlarged thyroid, questions addressed. Patient will be called with information on appointment.  Lab: TSH with panel  [id number], Hep C  Pap smear as above   counseled on breast self exam, adequate intake of calcium and vitamin D, diet and exercise  return annually or prn  An After Visit Summary was printed and given to the patient.

## 2016-01-20 NOTE — Patient Instructions (Signed)

## 2016-01-21 LAB — VITAMIN D 25 HYDROXY (VIT D DEFICIENCY, FRACTURES): Vit D, 25-Hydroxy: 48 ng/mL (ref 30–100)

## 2016-01-21 LAB — HEPATITIS C ANTIBODY: HCV Ab: NEGATIVE

## 2016-01-25 ENCOUNTER — Ambulatory Visit
Admission: RE | Admit: 2016-01-25 | Discharge: 2016-01-25 | Disposition: A | Discharge status: Home / Self Care | Payer: Managed Care, Other (non HMO) | Source: Ambulatory Visit | Attending: Certified Nurse Midwife | Admitting: Certified Nurse Midwife

## 2016-01-25 DIAGNOSIS — E049 Nontoxic goiter, unspecified: Secondary | ICD-10-CM

## 2016-01-29 NOTE — Progress Notes (Signed)
Encounter reviewed Kimika Streater, MD   

## 2016-03-11 ENCOUNTER — Telehealth: Payer: Self-pay

## 2016-03-11 ENCOUNTER — Encounter: Payer: Self-pay | Admitting: Certified Nurse Midwife

## 2016-03-11 NOTE — Telephone Encounter (Signed)
Telephone encounter created to discuss mychart message with Deborah Leonard CNM. 

## 2016-03-11 NOTE — Telephone Encounter (Signed)
Visit Follow-Up Question  Message [id number]   From  LILAS SPAR   To  Regina Eck, CNM   Sent  03/11/2016 1:18 PM     Since my thyroid levels were normal, and we've ruled that out as a reason for my extreme period symptoms and irregularity, would I benefit from going back on birth control pills to stabilize my hormones? I've really been struggling recently with the headaches, skin breakouts, and cramping that come with both ovulation and my period.   Thanks,  Sherry Chavez      Responsible Party    Pool - [facility] Clinical Pool No one has taken responsibility for this message.     No actions have been taken on this message.     Left message to call [place] at [phone].

## 2016-03-16 NOTE — Telephone Encounter (Signed)
RE: Visit Follow-Up Question     From   Jasmine Awe, RN    To   ONORA SCHWISOW    Sent and Delivered   03/11/2016 2:48 PM       Last Read in [place]   03/11/2016 2:49 PM by Ranae Plumber       Mrs.Earnstine Regal,   I have reviewed your message with Melvia Heaps CNM. It is her recommendation that you come in to the office for a brief visit to discuss oral contraception options. This way we can also check your blood pressure prior to starting you on a new oral birth control. I will try to reach you via telephone. If I do not reach you please feel free to contact the office at your earliest convenience to schedule an appointment with Melvia Heaps CNM.   Sincerely,  Rolla Etienne, RN       I have attempted to reach this patient via phone and mychart message as seen above. Mychart message was read on 03/11/2016. No return call or message has been received. Okay to close encounter?

## 2016-03-16 NOTE — Telephone Encounter (Signed)
yes

## 2016-03-28 ENCOUNTER — Telehealth: Payer: Self-pay

## 2016-03-28 ENCOUNTER — Encounter: Payer: Self-pay | Admitting: Certified Nurse Midwife

## 2016-03-28 DIAGNOSIS — E042 Nontoxic multinodular goiter: Secondary | ICD-10-CM

## 2016-03-28 NOTE — Telephone Encounter (Signed)
Visit Follow-Up Question  Message [id number]   From  Sherry Chavez   To  Regina Eck, CNM   Sent  03/28/2016 11:18 AM     I would like to be examined by an endocrinologist regarding the thyroid nodules found by ultrasound, which was ordered by Melvia Heaps. I have quite a bit of pressure in the area of my thyroid, along with difficulty swallowing. Can I get a referral, or should I make the appointment myself?  I would prefer to see Dr Altheimer at [facility], if possible.   Thanks,  Sherry Chavez      Responsible Party    Pool - [facility] Clinical Pool Message taken by Jasmine Awe, RN on 03/28/2016 1:46 PM     No actions have been taken on this message.     Notes Recorded by Regina Eck, CNM on 01/26/2016 at 8:18 AM Notify patient that Thyroid US shows very small few nodules bilateral and none that are concerning at this time. Her TSH and panel were normal, so No further follow up at this time. Needs follow up in office at [facility] in one year.  Kem Boroughs, FNP, may I play referral for this patient to be seen with Dr.Altheimer for evaluation?

## 2016-03-28 NOTE — Telephone Encounter (Signed)
Yes per pt request if she feels symptomatic with the nodules she may have questions or need peace of mind about this finding.

## 2016-03-28 NOTE — Telephone Encounter (Signed)
Telephone encounter created to discuss mychart message with Patricia Grubb, FNP. 

## 2016-03-29 NOTE — Telephone Encounter (Signed)
Referral has been placed for patient to be seen with Dr.Altheimer at [facility] for further evaluation. I have notified the patient of this referral via mychart. She will be contacted directly by Dr.Altheimer's office to schedule an appointment. I will also send this to Theresia Lo regarding referral follow up as needed.   Cc: Theresia Lo  Routing to provider for final review. Patient agreeable to disposition. Will close encounter.

## 2017-01-20 ENCOUNTER — Encounter: Payer: Self-pay | Admitting: Certified Nurse Midwife

## 2017-01-20 ENCOUNTER — Ambulatory Visit (INDEPENDENT_AMBULATORY_CARE_PROVIDER_SITE_OTHER): Payer: Managed Care, Other (non HMO) | Admitting: Certified Nurse Midwife

## 2017-01-20 VITALS — BP 94/62 | HR 76 | Resp 16 | Ht 64.75 in | Wt 133.0 lb

## 2017-01-20 DIAGNOSIS — Z23 Encounter for immunization: Secondary | ICD-10-CM

## 2017-01-20 DIAGNOSIS — Z01419 Encounter for gynecological examination (general) (routine) without abnormal findings: Secondary | ICD-10-CM | POA: Diagnosis not present

## 2017-01-20 DIAGNOSIS — Z124 Encounter for screening for malignant neoplasm of cervix: Secondary | ICD-10-CM | POA: Diagnosis not present

## 2017-01-20 DIAGNOSIS — Z0000 Encounter for general adult medical examination without abnormal findings: Secondary | ICD-10-CM

## 2017-01-20 DIAGNOSIS — N946 Dysmenorrhea, unspecified: Secondary | ICD-10-CM

## 2017-01-20 LAB — CBC
HCT: 39.8 % (ref 35.0–45.0)
Hemoglobin: 12.9 g/dL (ref 11.7–15.5)
MCH: 29.4 pg (ref 27.0–33.0)
MCHC: 32.4 g/dL (ref 32.0–36.0)
MCV: 90.7 fL (ref 80.0–100.0)
MPV: 10.6 fL (ref 7.5–12.5)
Platelets: 255 10*3/uL (ref 140–400)
RBC: 4.39 MIL/uL (ref 3.80–5.10)
RDW: 13.2 % (ref 11.0–15.0)
WBC: 6.4 10*3/uL (ref 3.8–10.8)

## 2017-01-20 MED ORDER — NORETHIN ACE-ETH ESTRAD-FE 1-20 MG-MCG PO TABS: 1.0000 | ORAL_TABLET | Freq: Every day | ORAL | 12 refills | Status: DC

## 2017-01-20 NOTE — Progress Notes (Signed)
36 y.o. [id number] Married  Caucasian Fe here for annual exam. Periods have extended to 8 days now with severe cramping. Has noted more generalized PMS symptoms from ovulation to onset with period. Still having some discomfort with sexual activity from ovulation until onset period also.(long term problem, no change) Denies bleeding with sexual activity. No medication use for after sexual activity. Denies vaginal itching or ? change in discharge. Some vaginal dryness and uses lubricant with good results. Sees Endocrine for enlarged thyroid management with no change in [place] and nodules at this point. Sees Urgent care if needed and had lipid profile for insurance which was normal. No other health issues today.   Patient's last menstrual period was 12/29/2016 (exact date).          Sexually active: Yes.    The current method of family planning is vasectomy.    Exercising: Yes.    run,bike Smoker:  no  Health Maintenance: Pap:  01-16-15 neg MMG:  06-10-14 category c density birads 1:neg Colonoscopy:  11-23-12 normal BMD:   none TDaP:  2008 Shingles: no Pneumonia: no Hep C and HIV: Hep c neg 2017, HIV neg 2012 Labs: none Self breast exam: done monthly   reports that she has never smoked. She has never used smokeless tobacco. She reports that she drinks about 1.2 oz of alcohol per week . She reports that she does not use drugs.  Past Medical History:  Diagnosis Date  . Enlarged thyroid   . GERD (gastroesophageal reflux disease) 2003  . IBS (irritable bowel syndrome)     Past Surgical History:  Procedure Laterality Date  . DILATION AND CURETTAGE OF UTERUS  06/2007  . [place]; Age 43    Current Outpatient Prescriptions  Medication Sig Dispense Refill  . Cholecalciferol (VITAMIN D PO) Take 2,000 Int'l Units by mouth daily.    . Multiple Vitamins-Minerals (MULTIVITAMIN PO) Take 1 tablet by mouth daily.     . Omega-3 Fatty Acids (FISH OIL PO) Take 1 tablet by mouth  daily.      No current facility-administered medications for this visit.     Family History  Problem Relation Age of Onset  . Ulcerative colitis Paternal Grandmother   . Diabetes Maternal Grandmother   . Irritable bowel syndrome Father   . Hypertension Mother   . Spina bifida Sister     ROS:  Pertinent items are noted in HPI.  Otherwise, a comprehensive ROS was negative.  Exam:   BP 94/62   Pulse 76   Resp 16   Ht 5' 4.75" (1.645 m)   Wt 133 lb (60.3 kg)   LMP 12/29/2016 (Exact Date)   BMI 22.30 kg/m  Height: 5' 4.75" (164.5 cm) Ht Readings from Last 3 Encounters:  01/20/17 5' 4.75" (1.645 m)  01/20/16 5' 4.75" (1.645 m)  01/21/15 5' 4.75" (1.645 m)    General appearance: alert, cooperative and appears stated age Head: Normocephalic, without obvious abnormality, atraumatic Neck: no adenopathy, supple, symmetrical, trachea midline and thyroid normal to inspection and palpation Lungs: clear to auscultation bilaterally Breasts: normal appearance, no masses or tenderness, No nipple retraction or dimpling, No nipple discharge or bleeding, No axillary or supraclavicular adenopathy Heart: regular rate and rhythm Abdomen: soft, non-tender; no masses,  no organomegaly Extremities: extremities normal, atraumatic, no cyanosis or edema Skin: Skin color, texture, turgor normal. No rashes or lesions Lymph nodes: Cervical, supraclavicular, and axillary nodes normal. No abnormal inguinal nodes palpated Neurologic: Grossly normal  Pelvic: External genitalia:  no lesions              Urethra:  normal appearing urethra with no masses, tenderness or lesions              Bartholin's and Skene's: normal                 Vagina: normal appearing vagina with normal color and discharge, no lesions, affirm taken              Cervix: multiparous appearance, no cervical motion tenderness and no lesions              Pap taken: Yes.   Bimanual Exam:  Uterus:  normal size, contour, position,  consistency, mobility, non-tender              Adnexa: normal adnexa and no mass, fullness, tenderness               Rectovaginal: Confirms               Anus:  normal sphincter tone, no lesions  Chaperone present: yes  A:  Well Woman with normal exam  Contraception spouse vasectomy  Dysmenorrhea with PMS  Screening labs  Immunization due  P:   Reviewed health and wellness pertinent to exam  Discussed OCP use to see if this will help for cycle control. Risks and benefits discussed, side effects, and expectations. Patient would like to try to see if this will help.  Rx Loestrin 1/20 Fe see order with instructions, recheck in 4 months unless issues.  Desires TDAP  Labs: CBC, Vitamin d  Pap smear as above   counseled on breast self exam, use and side effects of OCP's, adequate intake of calcium and vitamin D, diet and exercise  return annually or prn  An After Visit Summary was printed and given to the patient.

## 2017-01-20 NOTE — Patient Instructions (Signed)

## 2017-01-21 LAB — VITAMIN D 25 HYDROXY (VIT D DEFICIENCY, FRACTURES): Vit D, 25-Hydroxy: 41 ng/mL (ref 30–100)

## 2017-01-21 LAB — WET PREP BY MOLECULAR PROBE
CANDIDA SPECIES: NEGATIVE
GARDNERELLA VAGINALIS: NEGATIVE
TRICHOMONAS VAG: NEGATIVE

## 2017-01-24 NOTE — Progress Notes (Signed)
Encounter reviewed Joshoa Shawler, MD   

## 2017-01-25 LAB — IPS PAP TEST WITH HPV

## 2017-03-09 ENCOUNTER — Telehealth: Payer: Self-pay | Admitting: *Deleted

## 2017-03-09 ENCOUNTER — Encounter: Payer: Self-pay | Admitting: Certified Nurse Midwife

## 2017-03-09 DIAGNOSIS — N946 Dysmenorrhea, unspecified: Secondary | ICD-10-CM

## 2017-03-09 MED ORDER — NORETHIN ACE-ETH ESTRAD-FE 1-20 MG-MCG PO TABS: 1.0000 | ORAL_TABLET | Freq: Every day | ORAL | 3 refills | Status: DC

## 2017-03-09 NOTE — Telephone Encounter (Signed)
New order placed for Junel Fe, #3 pkgs/3RF. Message to patient via [place].  From Ranae Plumber To Regina Eck, CNM Sent 03/09/2017 2:17 PM  [employer] is going to make me pay full price for the Junel FE prescription if I pick it up in 30-day supply each month. Would it be possible for you to change the prescription to 90-day supply? The same pharmacy is fine....[facility]) in [place]. Thanks so much,  Sanjuana Kava   Routing to provider for final review. Patient is agreeable to disposition. Will close encounter.

## 2017-05-02 DIAGNOSIS — E042 Nontoxic multinodular goiter: Secondary | ICD-10-CM | POA: Insufficient documentation

## 2017-05-03 ENCOUNTER — Encounter: Payer: Self-pay | Admitting: Certified Nurse Midwife

## 2017-05-03 ENCOUNTER — Telehealth: Payer: Self-pay | Admitting: *Deleted

## 2017-05-03 NOTE — Telephone Encounter (Signed)
Melvia Heaps, CNM -please advise?  Non-Urgent Medical Question  Message [id number]  From AVELINE DAUS To Regina Eck, CNM Sent 05/03/2017 8:34 AM  Good morning,   I am currently in my 4th pack of the birth control pills, but I have had breakthrough bleeding every month since starting.  The first 3 months it began at the end of the 2nd week of active pills and lasted about a week, and this month it started at the beginning of the 2nd week and I'm now at day 5 of spotting. I'm definitely seeing an improvement in mood, cramping and overall pain, but the spotting is annoying! Should I continue with this type of pill and see if my body will adjust, or is there something else I should try?   Thank you,  K. Smelcer

## 2017-05-03 NOTE — Telephone Encounter (Signed)
Left detailed message, ok per current dpr. Advised as seen below per Melvia Heaps, CNM. Advised patient to return call to office at [phone] for assistance with scheduling. Advised patient office phones are off, will return on tomorrow morning at 8am.

## 2017-05-03 NOTE — Telephone Encounter (Signed)
See telephone encounter dated 05/03/17. 

## 2017-05-03 NOTE — Telephone Encounter (Signed)
I think if she is on 3 rd pack needs BP check and discussion to change pills. Needs OV

## 2017-05-04 NOTE — Telephone Encounter (Signed)
Spoke with patient to see if she wanted to schedule appt. Pt states she has 3 children with no babysitter & is on her 3rd week of her 4th pack. Pt to wait & have this menstrual cycle & if not better patient will schedule appt.

## 2017-05-04 NOTE — Telephone Encounter (Signed)
Routing to provider for final review. Patient is agreeable to disposition. Will close encounter.  

## 2017-10-30 ENCOUNTER — Other Ambulatory Visit: Payer: Self-pay

## 2017-10-30 ENCOUNTER — Encounter (HOSPITAL_COMMUNITY): Payer: Self-pay | Admitting: Emergency Medicine

## 2017-10-30 ENCOUNTER — Emergency Department (HOSPITAL_COMMUNITY)
Admission: EM | Admit: 2017-10-30 | Discharge: 2017-10-30 | Disposition: A | Discharge status: Home / Self Care | Payer: 59 | Attending: Emergency Medicine | Admitting: Emergency Medicine

## 2017-10-30 DIAGNOSIS — Z79899 Other long term (current) drug therapy: Secondary | ICD-10-CM | POA: Insufficient documentation

## 2017-10-30 DIAGNOSIS — M436 Torticollis: Secondary | ICD-10-CM | POA: Diagnosis not present

## 2017-10-30 DIAGNOSIS — M542 Cervicalgia: Secondary | ICD-10-CM | POA: Diagnosis present

## 2017-10-30 MED ORDER — ONDANSETRON HCL 4 MG PO TABS
4.0000 mg | ORAL_TABLET | Freq: Once | ORAL | Status: AC
  Administered 2017-10-30: 4 mg via ORAL
  Filled 2017-10-30: qty 1

## 2017-10-30 MED ORDER — TRAMADOL HCL 50 MG PO TABS: ORAL_TABLET | ORAL | 0 refills | Status: DC

## 2017-10-30 MED ORDER — DIAZEPAM 5 MG PO TABS
5.0000 mg | ORAL_TABLET | Freq: Once | ORAL | Status: AC
  Administered 2017-10-30: 5 mg via ORAL
  Filled 2017-10-30: qty 1

## 2017-10-30 MED ORDER — HYDROCODONE-ACETAMINOPHEN 5-325 MG PO TABS
1.0000 | ORAL_TABLET | Freq: Once | ORAL | Status: AC
  Administered 2017-10-30: 1 via ORAL
  Filled 2017-10-30: qty 1

## 2017-10-30 MED ORDER — DEXAMETHASONE 4 MG PO TABS: 4.0000 mg | ORAL_TABLET | Freq: Two times a day (BID) | ORAL | 0 refills | Status: DC

## 2017-10-30 MED ORDER — IBUPROFEN 400 MG PO TABS
400.0000 mg | ORAL_TABLET | Freq: Once | ORAL | Status: AC
  Administered 2017-10-30: 400 mg via ORAL
  Filled 2017-10-30: qty 1

## 2017-10-30 MED ORDER — CYCLOBENZAPRINE HCL 10 MG PO TABS: 10.0000 mg | ORAL_TABLET | Freq: Three times a day (TID) | ORAL | 0 refills | Status: DC

## 2017-10-30 NOTE — ED Triage Notes (Signed)
PT states neck pain with ROM after waking up this morning and felt a "pop" while getting out of bed. PT denies any injury and states some relief from 400mg  ibuprofen at 0700.

## 2017-10-30 NOTE — ED Provider Notes (Signed)
[facility] EMERGENCY DEPARTMENT Provider Note   CSN: [id number] Arrival date & time: 10/30/17  6203     History   Chief Complaint Chief Complaint  Patient presents with  . Neck Pain    HPI Sherry Chavez is a 36 y.o. female.  Patient is a 36 year old female who presents to the emergency department with a complaint of neck pain.  The patient states that she woke up this morning, felt a pop in the right side of her neck, and had a sensation of Velcro moving in her neck and shoulder area.  She has had pain since that time.  She feels better when she holds her neck still.  She feels worse when she moves her neck or shoulder.  The pain was accompanied by nausea, as well as some lightheadedness.  She has not had any recent operations or procedures involving the neck or shoulder area.  No loss of bowel or bladder function.  No difficulty with use of upper extremities other than pain with certain movement.  There is been no recent injury to be reported.  No previous history of similar symptoms.  Patient took 400 mg of ibuprofen at 7 this morning states she did get some mild relief from the ibuprofen medication.  She presents now for assistance with this issue.   The history is provided by the patient.  Neck Pain   Pertinent negatives include no photophobia and no chest pain.    Past Medical History:  Diagnosis Date  . Enlarged thyroid   . GERD (gastroesophageal reflux disease) 2003  . IBS (irritable bowel syndrome)     Patient Active Problem List   Diagnosis Date Noted  . Enlarged thyroid 01/20/2016    Class: Diagnosis of  . Irritable bowel syndrome 01/03/2014    Past Surgical History:  Procedure Laterality Date  . DILATION AND CURETTAGE OF UTERUS  06/2007  . [place]; Age 39    OB History    Gravida Para Term Preterm AB Living   5 3 3  0 2 3   SAB TAB Ectopic Multiple Live Births   2 0 0 0 3       Home Medications    Prior to  Admission medications   Medication Sig Start Date End Date Taking? Authorizing Provider  Cholecalciferol (VITAMIN D PO) Take 2,000 Int'l Units by mouth daily.    [provider]  Multiple Vitamins-Minerals (MULTIVITAMIN PO) Take 1 tablet by mouth daily.     [provider]  norethindrone-ethinyl estradiol (JUNEL FE,GILDESS FE,LOESTRIN FE) 1-20 MG-MCG tablet Take 1 tablet by mouth daily. 03/09/17   Regina Eck, CNM  Omega-3 Fatty Acids (FISH OIL PO) Take 1 tablet by mouth daily.     [provider]    Family History Family History  Problem Relation Age of Onset  . Irritable bowel syndrome Father   . Hypertension Mother   . Spina bifida Sister   . Ulcerative colitis Paternal Grandmother   . Diabetes Maternal Grandmother     Social History Social History   Tobacco Use  . Smoking status: Never Smoker  . Smokeless tobacco: Never Used  Substance Use Topics  . Alcohol use: Yes    Alcohol/week: 1.2 oz    Types: 2 Standard drinks or equivalent per week  . Drug use: No     Allergies   Codeine   Review of Systems Review of Systems  Constitutional: Negative for activity change.  All ROS Neg except as noted in HPI  HENT: Negative for nosebleeds.   Eyes: Negative for photophobia and discharge.  Respiratory: Negative for cough, shortness of breath and wheezing.   Cardiovascular: Negative for chest pain and palpitations.  Gastrointestinal: Positive for nausea. Negative for abdominal pain and blood in stool.  Genitourinary: Negative for dysuria, frequency and hematuria.  Musculoskeletal: Positive for neck pain. Negative for arthralgias and back pain.  Skin: Negative.   Neurological: Positive for light-headedness. Negative for dizziness, seizures and speech difficulty.  Psychiatric/Behavioral: Negative for confusion and hallucinations.     Physical Exam Updated Vital Signs BP 104/71 (BP Location: Left Arm)   Pulse 85   Temp 98 F (36.7 C)  (Oral)   Resp 18   Ht 5' 5.5" (1.664 m)   Wt 59 kg (130 lb)   LMP 10/23/2017   SpO2 100%   BMI 21.30 kg/m   Physical Exam  Constitutional: She is oriented to person, place, and time. She appears well-developed and well-nourished.  Non-toxic appearance.  HENT:  Head: Normocephalic.  Right Ear: Tympanic membrane and external ear normal.  Left Ear: Tympanic membrane and external ear normal.  Eyes: EOM and lids are normal. Pupils are equal, round, and reactive to light.  Neck: Normal range of motion. Neck supple. Carotid bruit is not present.  Cardiovascular: Normal rate, regular rhythm, normal heart sounds, intact distal pulses and normal pulses.  Pulmonary/Chest: Breath sounds normal. No respiratory distress.  Abdominal: Soft. Bowel sounds are normal. There is no tenderness. There is no guarding.  Musculoskeletal: Normal range of motion.  There is no palpable step-off of the cervical or thoracic spine.  There is tightness and tenseness of the upper trapezius on the right greater than on the left.  There is pain with attempted range of motion of the cervical spine area as well as with the right shoulder.  There are no temperature changes of the right or left upper extremity.  The radial pulses are 2+ bilaterally.  The capillary refill is less than 2 seconds.  Lymphadenopathy:       Head (right side): No submandibular adenopathy present.       Head (left side): No submandibular adenopathy present.    She has no cervical adenopathy.  Neurological: She is alert and oriented to person, place, and time. She has normal strength. No cranial nerve deficit or sensory deficit.  There are no motor or sensory deficits noted of the upper extremities.  Skin: Skin is warm and dry.  Psychiatric: She has a normal mood and affect. Her speech is normal.  Nursing note and vitals reviewed.    ED Treatments / Results  Labs (all labs ordered are listed, but only abnormal results are displayed) Labs  Reviewed - No data to display  EKG  EKG Interpretation None       Radiology No results found.  Procedures Procedures (including critical care time)  Medications Ordered in ED Medications - No data to display   Initial Impression / Assessment and Plan / ED Course  I have reviewed the triage vital signs and the nursing notes.  Pertinent labs & imaging results that were available during my care of the patient were reviewed by me and considered in my medical decision making (see chart for details).       Final Clinical Impressions(s) / ED Diagnoses MDM Vital signs within normal limits.  No gross neurologic deficits appreciated.  No motor or sensory deficits of the upper extremity.  Gait is intact.  No facial asymmetry noted.  The examination favors torticollis.  I discussed with the patient the importance of resting her neck and shoulder.  The importance of using a heating pad to the area.  Prescription for Flexeril, Decadron, and Ultram given to the patient to use.  The patient will see the primary physician or return to the emergency department if any changes, problems, or concerns.   Final diagnoses:  Torticollis    ED Discharge Orders        Ordered    cyclobenzaprine (FLEXERIL) 10 MG tablet  3 times daily     10/30/17 1015    traMADol (ULTRAM) 50 MG tablet     10/30/17 1015    dexamethasone (DECADRON) 4 MG tablet  2 times daily with meals     10/30/17 1015       Lily Kocher, PA-C 10/30/17 1035    Fredia Sorrow, MD 11/01/17 1601

## 2017-10-30 NOTE — Discharge Instructions (Signed)
Your examination suggest muscle strain/injury to the neck and shoulder area.  (Torticollis) please use a heating pad to the area.  Please rest her neck and shoulder as much as possible.  Use Flexeril 3 times daily.  Decadron 2 times daily with food.  Use ibuprofen every 6 hours for mild pain, use Ultram for more severe pain.  Flexeril and Ultram may cause drowsiness.  Please do not drive a vehicle, operate machinery, and/or sharp objects, or participate in activities requiring concentration when taking either of these medications.  Please see your primary physician, or return to the emergency department if not improving.

## 2018-01-04 ENCOUNTER — Encounter: Payer: Self-pay | Admitting: Certified Nurse Midwife

## 2018-01-04 ENCOUNTER — Telehealth: Payer: Self-pay | Admitting: Certified Nurse Midwife

## 2018-01-04 NOTE — Telephone Encounter (Signed)
Patient sent the following correspondence through [facility]. Routing to triage to assist patient with request.  ----- Message from [place], Generic sent at 01/04/2018 3:47 PM EST -----    My husband was confirmed to have the flu today and one of my daughter is now showing symptoms. Would it be possible for me to get a prophylactic Rx for Tamiflu? I don't currently have a primary care provider for myself. Thanks, K. Lio

## 2018-01-04 NOTE — Telephone Encounter (Signed)
Tamiflu 01/04/2018 4:52 PM    To: Ranae Plumber    From: Gwendlyn Deutscher, RN    Created: 01/04/2018 4:52 PM     Shelly Flatten CNM has reviewed your MyChart message and Tamiflu is not a medication our practice is able to prescribe. If your husband has seen a provider recently such as a primary care provider they may be able to prescribe this to you as they diagnosed him. You could of course seek evaluation with an urgent care or minute clinic who may be able to prescribe this as well. I hope this helps. Please let us know if have any further questions.  Sincerely,  Reesa Chew, RN     Routing to provider for final review. Patient agreeable to disposition. Will close encounter.

## 2018-01-23 ENCOUNTER — Encounter: Payer: Self-pay | Admitting: Certified Nurse Midwife

## 2018-01-23 ENCOUNTER — Ambulatory Visit (INDEPENDENT_AMBULATORY_CARE_PROVIDER_SITE_OTHER): Payer: 59 | Admitting: Certified Nurse Midwife

## 2018-01-23 ENCOUNTER — Other Ambulatory Visit: Payer: Self-pay

## 2018-01-23 VITALS — BP 108/60 | HR 72 | Resp 14 | Ht 65.0 in | Wt 132.5 lb

## 2018-01-23 DIAGNOSIS — Z01419 Encounter for gynecological examination (general) (routine) without abnormal findings: Secondary | ICD-10-CM | POA: Diagnosis not present

## 2018-01-23 NOTE — Patient Instructions (Signed)

## 2018-01-23 NOTE — Progress Notes (Signed)
37 y.o. [id number] Married  Caucasian Fe here for annual exam. Periods normal, no issues. Sees Urgent care if needed. No health issues this year. Spouse had Flu, but no other family members. Sees Dr. Tilden Fossa with thyroid management, all stable and had screening labs done there. None needed today. No health concerns today.   Patient's last menstrual period was 01/01/2018.          Sexually active: Yes.    The current method of family planning is vasectomy.    Exercising: Yes.    treadmil, bike Smoker:  no  Health Maintenance: Pap:  01-16-15 neg, 01-20-17 neg HPV HR neg History of Abnormal Pap: no MMG:  06-10-14 category c density birads 1:neg Self Breast exams: yes Colonoscopy:  2013 normal age 64 BMD:   none TDaP:  2018 Shingles: no Pneumonia: no Hep C and HIV: Hep c neg 2017, HIV neg 2012 Labs: discuss with provider    reports that  has never smoked. she has never used smokeless tobacco. She reports that she drinks about 1.2 oz of alcohol per week. She reports that she does not use drugs.  Past Medical History:  Diagnosis Date  . Enlarged thyroid   . GERD (gastroesophageal reflux disease) 2003  . IBS (irritable bowel syndrome)     Past Surgical History:  Procedure Laterality Date  . DILATION AND CURETTAGE OF UTERUS  06/2007  . [place]; Age 71    Current Outpatient Medications  Medication Sig Dispense Refill  . Cholecalciferol (VITAMIN D PO) Take 2,000 Int'l Units by mouth daily.    . Multiple Vitamins-Minerals (MULTIVITAMIN PO) Take 1 tablet by mouth daily.     . Omega-3 Fatty Acids (FISH OIL PO) Take 1 tablet by mouth daily.     . vitamin C (ASCORBIC ACID) 500 MG tablet Take 1 tablet once a day     No current facility-administered medications for this visit.     Family History  Problem Relation Age of Onset  . Irritable bowel syndrome Father   . Hypertension Mother   . Spina bifida Sister   . Ulcerative colitis Paternal Grandmother   .  Diabetes Maternal Grandmother     ROS:  Pertinent items are noted in HPI.  Otherwise, a comprehensive ROS was negative.  Exam:   BP 108/60 (BP Location: Right Arm, Patient Position: Sitting, Cuff Size: Normal)   Pulse 72   Resp 14   Ht 5\' 5"  (1.651 m)   Wt 132 lb 8 oz (60.1 kg)   LMP 01/01/2018   BMI 22.05 kg/m  Height: 5\' 5"  (165.1 cm) Ht Readings from Last 3 Encounters:  01/23/18 5\' 5"  (1.651 m)  10/30/17 5' 5.5" (1.664 m)  01/20/17 5' 4.75" (1.645 m)    General appearance: alert, cooperative and appears stated age Head: Normocephalic, without obvious abnormality, atraumatic Neck: no adenopathy, supple, symmetrical, trachea midline and thyroid enlarged and no change from previous exam Lungs: clear to auscultation bilaterally Breasts: normal appearance, no masses or tenderness, No nipple retraction or dimpling, No nipple discharge or bleeding, No axillary or supraclavicular adenopathy Heart: regular rate and rhythm Abdomen: soft, non-tender; no masses,  no organomegaly Extremities: extremities normal, atraumatic, no cyanosis or edema Skin: Skin color, texture, turgor normal. No rashes or lesions Lymph nodes: Cervical, supraclavicular, and axillary nodes normal. No abnormal inguinal nodes palpated Neurologic: Grossly normal   Pelvic: External genitalia:  no lesions  Urethra:  normal appearing urethra with no masses, tenderness or lesions              Bartholin's and Skene's: normal                 Vagina: normal appearing vagina with normal color and discharge, no lesions              Cervix: multiparous appearance, no cervical motion tenderness and no lesions              Pap taken: No. Bimanual Exam:  Uterus:  normal size, contour, position, consistency, mobility, non-tender              Adnexa: normal adnexa and no mass, fullness, tenderness               Rectovaginal: Confirms               Anus:  normal sphincter tone, no lesions  Chaperone present:  yes  A:  Well Woman with normal exam  Contraception spouse vasectomy  Enlarged thyroid with Endocrine management, no size change from previous exam  P:   Reviewed health and wellness pertinent to exam  Discussed importance of follow up as indicated.  Declines labs today  Pap smear: no   counseled on breast self exam, adequate intake of calcium and vitamin D, diet and exercise  return annually or prn  An After Visit Summary was printed and given to the patient.

## 2018-05-01 ENCOUNTER — Telehealth: Payer: Self-pay | Admitting: General Practice

## 2018-05-01 NOTE — Telephone Encounter (Signed)
Patient has sent a New Patient request on [facility] for Dr.Crawford.   Please advise. Thank you.

## 2018-05-02 NOTE — Telephone Encounter (Signed)
LVM to inform patient Dr.Crawford has approved her as a New Patient.   Please set up her a NP appointment with Dr.Crawford when she returns call.   Office type - OV & in note comments - NEW PATIENT/ EST CARE - Insurance?  Thank you.

## 2018-05-02 NOTE — Telephone Encounter (Signed)
Fine, no more than 1 new patient per day 

## 2018-05-02 NOTE — Telephone Encounter (Signed)
Appointment has been made 05/16/18 - New patient paperwork has been sent.

## 2018-05-16 ENCOUNTER — Ambulatory Visit (INDEPENDENT_AMBULATORY_CARE_PROVIDER_SITE_OTHER): Payer: 59 | Admitting: Internal Medicine

## 2018-05-16 ENCOUNTER — Other Ambulatory Visit (INDEPENDENT_AMBULATORY_CARE_PROVIDER_SITE_OTHER): Payer: 59

## 2018-05-16 ENCOUNTER — Encounter: Payer: Self-pay | Admitting: Internal Medicine

## 2018-05-16 VITALS — BP 100/60 | HR 71 | Temp 98.2°F | Ht 65.0 in | Wt 135.0 lb

## 2018-05-16 DIAGNOSIS — Z0000 Encounter for general adult medical examination without abnormal findings: Secondary | ICD-10-CM

## 2018-05-16 DIAGNOSIS — E049 Nontoxic goiter, unspecified: Secondary | ICD-10-CM

## 2018-05-16 DIAGNOSIS — K580 Irritable bowel syndrome with diarrhea: Secondary | ICD-10-CM | POA: Diagnosis not present

## 2018-05-16 DIAGNOSIS — E042 Nontoxic multinodular goiter: Secondary | ICD-10-CM

## 2018-05-16 LAB — COMPREHENSIVE METABOLIC PANEL
ALBUMIN: 4.8 g/dL (ref 3.5–5.2)
ALK PHOS: 41 U/L (ref 39–117)
ALT: 8 U/L (ref 0–35)
AST: 11 U/L (ref 0–37)
BILIRUBIN TOTAL: 0.5 mg/dL (ref 0.2–1.2)
BUN: 9 mg/dL (ref 6–23)
CALCIUM: 10 mg/dL (ref 8.4–10.5)
CO2: 30 mEq/L (ref 19–32)
Chloride: 105 mEq/L (ref 96–112)
Creatinine, Ser: 0.58 mg/dL (ref 0.40–1.20)
GFR: 125.33 mL/min (ref >60.00)
Glucose, Bld: 96 mg/dL (ref 70–99)
Potassium: 4.6 mEq/L (ref 3.5–5.1)
Sodium: 142 mEq/L (ref 135–145)
TOTAL PROTEIN: 7.2 g/dL (ref 6.0–8.3)

## 2018-05-16 LAB — CBC
HCT: 41 % (ref 36.0–46.0)
Hemoglobin: 14 g/dL (ref 12.0–15.0)
MCHC: 34.3 g/dL (ref 30.0–36.0)
MCV: 89.3 fl (ref 78.0–100.0)
Platelets: 253 K/uL (ref 150.0–400.0)
RBC: 4.59 Mil/uL (ref 3.87–5.11)
RDW: 12.7 % (ref 11.5–15.5)
WBC: 3.4 K/uL — ABNORMAL LOW (ref 4.0–10.5)

## 2018-05-16 LAB — LIPID PANEL
CHOLESTEROL: 161 mg/dL (ref 0–200)
HDL: 54.6 mg/dL (ref >39.00)
LDL Cholesterol: 94 mg/dL (ref 0–99)
NONHDL: 106.71
TRIGLYCERIDES: 66 mg/dL (ref 0.0–149.0)
Total CHOL/HDL Ratio: 3
VLDL: 13.2 mg/dL (ref 0.0–40.0)

## 2018-05-16 LAB — VITAMIN B12: Vitamin B-12: 379 pg/mL (ref 211–911)

## 2018-05-16 LAB — VITAMIN D 25 HYDROXY (VIT D DEFICIENCY, FRACTURES): VITD: 26.71 ng/mL — ABNORMAL LOW (ref 30.00–100.00)

## 2018-05-16 LAB — FERRITIN: Ferritin: 33.7 ng/mL (ref 10.0–291.0)

## 2018-05-16 NOTE — Progress Notes (Signed)
   Subjective:    Patient ID: Sherry Chavez, female    DOB: Dec 10, 1980, 37 y.o.   MRN: [id number]  HPI The patient is a 37 YO female coming in new for physical. Denies new concerns. Some fatigue and muscle aching which she associates with gluten. Prior testing which was not revealing of gluten allergy some years ago. She does avoid gluten and can tell symptoms when she eats gluten.   PMH, [facility], social history reviewed and updated.   Review of Systems  Constitutional: Negative.   HENT: Negative.   Eyes: Negative.   Respiratory: Negative for cough, chest tightness and shortness of breath.   Cardiovascular: Negative for chest pain, palpitations and leg swelling.  Gastrointestinal: Positive for diarrhea. Negative for abdominal distention, abdominal pain, constipation, nausea and vomiting.  Musculoskeletal: Negative.   Skin: Negative.   Neurological: Negative.   Psychiatric/Behavioral: Negative.       Objective:   Physical Exam  Constitutional: She is oriented to person, place, and time. She appears well-developed and well-nourished.  HENT:  Head: Normocephalic and atraumatic.  Eyes: EOM are normal.  Neck: Normal range of motion.  Cardiovascular: Normal rate and regular rhythm.  Pulmonary/Chest: Effort normal and breath sounds normal. No respiratory distress. She has no wheezes. She has no rales.  Abdominal: Soft. Bowel sounds are normal. She exhibits no distension. There is no tenderness. There is no rebound.  Musculoskeletal: She exhibits no edema.  Neurological: She is alert and oriented to person, place, and time. Coordination normal.  Skin: Skin is warm and dry.  Psychiatric: She has a normal mood and affect.   Vitals:   05/16/18 0905  BP: 100/60  Pulse: 71  Temp: 98.2 F (36.8 C)  TempSrc: Oral  SpO2: 99%  Weight: 135 lb (61.2 kg)  Height: 5\' 5"  (1.651 m)      Assessment & Plan:

## 2018-05-16 NOTE — Patient Instructions (Addendum)
Turmeric is something that might help with the muscles.   Health Maintenance, Female Adopting a healthy lifestyle and getting preventive care can go a long way to promote health and wellness. Talk with your health care provider about what schedule of regular examinations is right for you. This is a good chance for you to check in with your provider about disease prevention and staying healthy. In between checkups, there are plenty of things you can do on your own. Experts have done a lot of research about which lifestyle changes and preventive measures are most likely to keep you healthy. Ask your health care provider for more information. Weight and diet Eat a healthy diet  Be sure to include plenty of vegetables, fruits, low-fat dairy products, and lean protein.  Do not eat a lot of foods high in solid fats, added sugars, or salt.  Get regular exercise. This is one of the most important things you can do for your health. ? Most adults should exercise for at least 150 minutes each week. The exercise should increase your heart rate and make you sweat (moderate-intensity exercise). ? Most adults should also do strengthening exercises at least twice a week. This is in addition to the moderate-intensity exercise.  Maintain a healthy weight  Body mass index (BMI) is a measurement that can be used to identify possible weight problems. It estimates body fat based on height and weight. Your health care provider can help determine your BMI and help you achieve or maintain a healthy weight.  For females 17 years of age and older: ? A BMI below 18.5 is considered underweight. ? A BMI of 18.5 to 24.9 is normal. ? A BMI of 25 to 29.9 is considered overweight. ? A BMI of 30 and above is considered obese.  Watch levels of cholesterol and blood lipids  You should start having your blood tested for lipids and cholesterol at 36 years of age, then have this test every 5 years.  You may need to have your  cholesterol levels checked more often if: ? Your lipid or cholesterol levels are high. ? You are older than 36 years of age. ? You are at high risk for heart disease.  Cancer screening Lung Cancer  Lung cancer screening is recommended for adults 76-21 years old who are at high risk for lung cancer because of a history of smoking.  A yearly low-dose CT scan of the lungs is recommended for people who: ? Currently smoke. ? Have quit within the past 15 years. ? Have at least a 30-pack-year history of smoking. A pack year is smoking an average of one pack of cigarettes a day for 1 year.  Yearly screening should continue until it has been 15 years since you quit.  Yearly screening should stop if you develop a health problem that would prevent you from having lung cancer treatment.  Breast Cancer  Practice breast self-awareness. This means understanding how your breasts normally appear and feel.  It also means doing regular breast self-exams. Let your health care provider know about any changes, no matter how small.  If you are in your 20s or 30s, you should have a clinical breast exam (CBE) by a health care provider every 1-3 years as part of a regular health exam.  If you are 78 or older, have a CBE every year. Also consider having a breast X-ray (mammogram) every year.  If you have a family history of breast cancer, talk to your health care  provider about genetic screening.  If you are at high risk for breast cancer, talk to your health care provider about having an MRI and a mammogram every year.  Breast cancer gene (BRCA) assessment is recommended for women who have family members with BRCA-related cancers. BRCA-related cancers include: ? Breast. ? Ovarian. ? Tubal. ? Peritoneal cancers.  Results of the assessment will determine the need for genetic counseling and BRCA1 and BRCA2 testing.  Cervical Cancer Your health care provider may recommend that you be screened regularly  for cancer of the pelvic organs (ovaries, uterus, and vagina). This screening involves a pelvic examination, including checking for microscopic changes to the surface of your cervix (Pap test). You may be encouraged to have this screening done every 3 years, beginning at age 21.  For women ages 30-65, health care providers may recommend pelvic exams and Pap testing every 3 years, or they may recommend the Pap and pelvic exam, combined with testing for human papilloma virus (HPV), every 5 years. Some types of HPV increase your risk of cervical cancer. Testing for HPV may also be done on women of any age with unclear Pap test results.  Other health care providers may not recommend any screening for nonpregnant women who are considered low risk for pelvic cancer and who do not have symptoms. Ask your health care provider if a screening pelvic exam is right for you.  If you have had past treatment for cervical cancer or a condition that could lead to cancer, you need Pap tests and screening for cancer for at least 20 years after your treatment. If Pap tests have been discontinued, your risk factors (such as having a new sexual partner) need to be reassessed to determine if screening should resume. Some women have medical problems that increase the chance of getting cervical cancer. In these cases, your health care provider may recommend more frequent screening and Pap tests.  Colorectal Cancer  This type of cancer can be detected and often prevented.  Routine colorectal cancer screening usually begins at 36 years of age and continues through 36 years of age.  Your health care provider may recommend screening at an earlier age if you have risk factors for colon cancer.  Your health care provider may also recommend using home test kits to check for hidden blood in the stool.  A small camera at the end of a tube can be used to examine your colon directly (sigmoidoscopy or colonoscopy). This is done to  check for the earliest forms of colorectal cancer.  Routine screening usually begins at age 50.  Direct examination of the colon should be repeated every 5-10 years through 36 years of age. However, you may need to be screened more often if early forms of precancerous polyps or small growths are found.  Skin Cancer  Check your skin from head to toe regularly.  Tell your health care provider about any new moles or changes in moles, especially if there is a change in a mole's shape or color.  Also tell your health care provider if you have a mole that is larger than the size of a pencil eraser.  Always use sunscreen. Apply sunscreen liberally and repeatedly throughout the day.  Protect yourself by wearing long sleeves, pants, a wide-brimmed hat, and sunglasses whenever you are outside.  Heart disease, diabetes, and high blood pressure  High blood pressure causes heart disease and increases the risk of stroke. High blood pressure is more likely to develop in: ?   People who have blood pressure in the high end of the normal range (130-139/85-89 mm Hg). ? People who are overweight or obese. ? People who are African American.  If you are 18-39 years of age, have your blood pressure checked every 3-5 years. If you are 40 years of age or older, have your blood pressure checked every year. You should have your blood pressure measured twice-once when you are at a hospital or clinic, and once when you are not at a hospital or clinic. Record the average of the two measurements. To check your blood pressure when you are not at a hospital or clinic, you can use: ? An automated blood pressure machine at a pharmacy. ? A home blood pressure monitor.  If you are between 55 years and 79 years old, ask your health care provider if you should take aspirin to prevent strokes.  Have regular diabetes screenings. This involves taking a blood sample to check your fasting blood sugar level. ? If you are at a  normal weight and have a low risk for diabetes, have this test once every three years after 36 years of age. ? If you are overweight and have a high risk for diabetes, consider being tested at a younger age or more often. Preventing infection Hepatitis B  If you have a higher risk for hepatitis B, you should be screened for this virus. You are considered at high risk for hepatitis B if: ? You were born in a country where hepatitis B is common. Ask your health care provider which countries are considered high risk. ? Your parents were born in a high-risk country, and you have not been immunized against hepatitis B (hepatitis B vaccine). ? You have HIV or AIDS. ? You use needles to inject street drugs. ? You live with someone who has hepatitis B. ? You have had sex with someone who has hepatitis B. ? You get hemodialysis treatment. ? You take certain medicines for conditions, including cancer, organ transplantation, and autoimmune conditions.  Hepatitis C  Blood testing is recommended for: ? Everyone born from 1945 through 1965. ? Anyone with known risk factors for hepatitis C.  Sexually transmitted infections (STIs)  You should be screened for sexually transmitted infections (STIs) including gonorrhea and chlamydia if: ? You are sexually active and are younger than 36 years of age. ? You are older than 36 years of age and your health care provider tells you that you are at risk for this type of infection. ? Your sexual activity has changed since you were last screened and you are at an increased risk for chlamydia or gonorrhea. Ask your health care provider if you are at risk.  If you do not have HIV, but are at risk, it may be recommended that you take a prescription medicine daily to prevent HIV infection. This is called pre-exposure prophylaxis (PrEP). You are considered at risk if: ? You are sexually active and do not regularly use condoms or know the HIV status of your  partner(s). ? You take drugs by injection. ? You are sexually active with a partner who has HIV.  Talk with your health care provider about whether you are at high risk of being infected with HIV. If you choose to begin PrEP, you should first be tested for HIV. You should then be tested every 3 months for as long as you are taking PrEP. Pregnancy  If you are premenopausal and you may become pregnant, ask your health   care provider about preconception counseling.  If you may become pregnant, take 400 to 800 micrograms (mcg) of folic acid every day.  If you want to prevent pregnancy, talk to your health care provider about birth control (contraception). Osteoporosis and menopause  Osteoporosis is a disease in which the bones lose minerals and strength with aging. This can result in serious bone fractures. Your risk for osteoporosis can be identified using a bone density scan.  If you are 65 years of age or older, or if you are at risk for osteoporosis and fractures, ask your health care provider if you should be screened.  Ask your health care provider whether you should take a calcium or vitamin D supplement to lower your risk for osteoporosis.  Menopause may have certain physical symptoms and risks.  Hormone replacement therapy may reduce some of these symptoms and risks. Talk to your health care provider about whether hormone replacement therapy is right for you. Follow these instructions at home:  Schedule regular health, dental, and eye exams.  Stay current with your immunizations.  Do not use any tobacco products including cigarettes, chewing tobacco, or electronic cigarettes.  If you are pregnant, do not drink alcohol.  If you are breastfeeding, limit how much and how often you drink alcohol.  Limit alcohol intake to no more than 1 drink per day for nonpregnant women. One drink equals 12 ounces of beer, 5 ounces of wine, or 1 ounces of hard liquor.  Do not use street  drugs.  Do not share needles.  Ask your health care provider for help if you need support or information about quitting drugs.  Tell your health care provider if you often feel depressed.  Tell your health care provider if you have ever been abused or do not feel safe at home. This information is not intended to replace advice given to you by your health care provider. Make sure you discuss any questions you have with your health care provider. Document Released: 06/06/2011 Document Revised: 04/28/2016 Document Reviewed: 08/25/2015 Elsevier Interactive Patient Education  2018 Elsevier Inc.  

## 2018-05-18 DIAGNOSIS — Z0000 Encounter for general adult medical examination without abnormal findings: Secondary | ICD-10-CM | POA: Insufficient documentation

## 2018-05-18 LAB — ANA: Anti Nuclear Antibody(ANA): NEGATIVE

## 2018-05-18 NOTE — Assessment & Plan Note (Signed)
Gluten sensitivity and she avoids. Has had prior GI testing.

## 2018-05-18 NOTE — Assessment & Plan Note (Signed)
Noted by gyn and then followed by endo without any signs of hashimoto's currently. [place] with small cysts. Clinically euthyroid. Testing with endo yearly. No symptoms.

## 2018-05-18 NOTE — Assessment & Plan Note (Addendum)
Tdap up to date, reminded about yearly flu shot. Pap smear up to date with gyn. Counseled about sun safety and dangers of distracted driving. Given screening recommendations. Checking screening labs.

## 2018-07-17 ENCOUNTER — Telehealth: Payer: Self-pay | Admitting: Obstetrics and Gynecology

## 2018-07-17 ENCOUNTER — Telehealth: Payer: Self-pay | Admitting: Certified Nurse Midwife

## 2018-07-17 DIAGNOSIS — N946 Dysmenorrhea, unspecified: Secondary | ICD-10-CM

## 2018-07-17 DIAGNOSIS — R102 Pelvic and perineal pain: Secondary | ICD-10-CM

## 2018-07-17 NOTE — Telephone Encounter (Signed)
Spoke with patient. Patient reports intermittent pelvic pain for the last 2 yrs, has become more "severe" in the last 6 months. Pain is mostly on left side. Pain is worse with menses and intercourse. Has tried OCP in the past with no changes. LMP 1 wk ago. Menses are regular. Spouse vasectomy for contraceptive.   Denies vaginal d/c, odor, N/V, fever/chills.   Mom has hx of endometriosis, patient requesting to schedule with MD for further evaluation.   PUS scheduled for 8/27 at 12:30pm with consult to follow at 1pm with Dr. Talbert Nan. Advised will review with providers and return call with any additional recommendations.   Dr. Talbert Nan -ok to proceed with PUS as scheduled?   Cc: Melvia Heaps, CNM

## 2018-07-17 NOTE — Telephone Encounter (Signed)
Yes, thank you.

## 2018-07-17 NOTE — Telephone Encounter (Signed)
Order placed for PUS.   Routing to Suzy Dixon and Rosa Davis for precert.   Encounter closed.  

## 2018-07-17 NOTE — Telephone Encounter (Signed)
Call placed to patient to review benefits for a scheduled ultrasound appointment. Left a voicemail message requesting a return call

## 2018-07-17 NOTE — Telephone Encounter (Signed)
Patient is having pelvic pain and would like to talk with Sherry Chavez.

## 2018-07-17 NOTE — Telephone Encounter (Signed)
Patient returned call. Reviewed benefit for scheduled ultrasound. Patient understood and agreeable. Patient scheduled 07/31/18 with Dr Sherry Chavez. Patient aware of appointment date, arrival time and cancellation policy.  No further questions. Ok to close

## 2018-07-30 ENCOUNTER — Telehealth: Payer: Self-pay | Admitting: Obstetrics and Gynecology

## 2018-07-30 NOTE — Telephone Encounter (Addendum)
Patient called and cancelled her ultrasound on 07/31/18 due to starting her menstrual cycle. She said she is not comfortable coming for this visit on her cycle. Routing to triage for rescheduling.  Cc: Suzy for [employer]

## 2018-07-30 NOTE — Telephone Encounter (Signed)
Spoke with patient. Request to reschedule PUS due to menses. Menses started 8/26. PUS rescheduled to 08/07/18 at 1:30pm, consult to follow at 2pm with Dr. Talbert Nan. Patient verbalizes understanding and is agreeable.   Routing to [employer] for [employer].   Encounter closed.

## 2018-07-31 ENCOUNTER — Other Ambulatory Visit: Payer: 59

## 2018-07-31 ENCOUNTER — Other Ambulatory Visit: Payer: 59 | Admitting: Obstetrics and Gynecology

## 2018-08-07 ENCOUNTER — Other Ambulatory Visit: Payer: Self-pay

## 2018-08-07 ENCOUNTER — Ambulatory Visit (INDEPENDENT_AMBULATORY_CARE_PROVIDER_SITE_OTHER): Payer: 59

## 2018-08-07 ENCOUNTER — Encounter: Payer: Self-pay | Admitting: Obstetrics and Gynecology

## 2018-08-07 ENCOUNTER — Ambulatory Visit (INDEPENDENT_AMBULATORY_CARE_PROVIDER_SITE_OTHER): Payer: 59 | Admitting: Obstetrics and Gynecology

## 2018-08-07 VITALS — BP 102/54 | HR 78 | Resp 14 | Ht 65.5 in | Wt 137.0 lb

## 2018-08-07 DIAGNOSIS — N940 Mittelschmerz: Secondary | ICD-10-CM | POA: Diagnosis not present

## 2018-08-07 DIAGNOSIS — N941 Unspecified dyspareunia: Secondary | ICD-10-CM | POA: Diagnosis not present

## 2018-08-07 DIAGNOSIS — N946 Dysmenorrhea, unspecified: Secondary | ICD-10-CM

## 2018-08-07 DIAGNOSIS — R102 Pelvic and perineal pain: Secondary | ICD-10-CM

## 2018-08-07 MED ORDER — NORETHINDRONE ACET-ETHINYL EST 1.5-30 MG-MCG PO TABS: 1.0000 | ORAL_TABLET | Freq: Every day | ORAL | 0 refills | Status: DC

## 2018-08-07 NOTE — Progress Notes (Signed)
GYNECOLOGY  VISIT   HPI: 37 y.o.   Married  Caucasian  female   [phone] with Patient's last menstrual period was 07/30/2018.   here for evaluation of pelvic pain. She has had some degree of pelvic pain intermittently for years, more consistent in the last 6 months.  She c/o a tugging, gnawing ache, feels full. There is some aching between her vagina and her rectum deep inside. Left side is more uncomfortable than the right. Can radiate to her left hip or tailbone. She always gets the pain with ovulation, lasts for 3-4 days. Also has the pain for 3-4 days prior to her cycle. Then has regular menstrual cramps with her cycle. She can have recurrence of the pain after her cycle for a few days, then random throughout the month. Every day she has some level of discomfort.  Intermittent deep dyspareunia, worse around ovulation. Sometimes has pain the day after sex.  Pain ranges from a 2 to a 7-8 out of 10 in severity. Ibuprofen takes the edge off of the pain. Heating pad helps. The pain always lasts for hours and can last for days.  She has a h/o IBS, has had bowel issues all of her life.  She had a colonoscopy 6 years ago, it was okay.  Vasectomy for contraception.  She tried OCP's to help her ovulation pain and spotted for 5 months.  Cycles are monthly (q21-33 days) x 4 days. She can saturate a super tampon in 8 hours for one day, the other days are lighter. Menstrual cramps are helped with motrin.   GYNECOLOGIC HISTORY: Patient's last menstrual period was 07/30/2018. Contraception: vasectomy  Menopausal hormone therapy: none         OB History    Gravida  5   Para  3   Term  3   Preterm  0   AB  2   Living  3     SAB  2   TAB  0   Ectopic  0   Multiple  0   Live Births  3            3 FTNSVD  Patient Active Problem List   Diagnosis Date Noted  . Routine general medical examination at a health care facility 05/18/2018  . Multinodular goiter (nontoxic) 05/02/2017    . Irritable bowel syndrome 01/03/2014    Past Medical History:  Diagnosis Date  . Blood in stool   . Chicken pox   . Enlarged thyroid   . Frequent headaches   . GERD (gastroesophageal reflux disease) 2003  . IBS (irritable bowel syndrome)   . UTI (urinary tract infection)     Past Surgical History:  Procedure Laterality Date  . DILATION AND CURETTAGE OF UTERUS  06/2007  . [place]; Age 66    Current Outpatient Medications  Medication Sig Dispense Refill  . Cholecalciferol (VITAMIN D PO) Take 2,000 Int'l Units by mouth daily.    . Magnesium Citrate 200 MG TABS Take 200 mg by mouth daily.    . Multiple Vitamins-Minerals (MULTIVITAMIN PO) Take 1 tablet by mouth daily.     . Turmeric 400 MG CAPS     . vitamin C (ASCORBIC ACID) 500 MG tablet Take 1 tablet once a day     No current facility-administered medications for this visit.      ALLERGIES: Codeine  Family History  Problem Relation Age of Onset  . Irritable bowel syndrome Father   .  Hypertension Mother   . Spina bifida Sister   . Ulcerative colitis Paternal Grandmother   . Alcohol abuse Paternal Grandmother   . Diabetes Maternal Grandmother   . Stroke Maternal Grandmother   . Cancer Maternal Grandfather   . Hearing loss Paternal Grandfather     Social History   Socioeconomic History  . Marital status: Married    Spouse name: Not on file  . Number of children: 3  . Years of education: Not on file  . Highest education level: Not on file  Occupational History  . Occupation: home maker  Social Needs  . Financial resource strain: Not on file  . Food insecurity:    Worry: Not on file    Inability: Not on file  . Transportation needs:    Medical: Not on file    Non-medical: Not on file  Tobacco Use  . Smoking status: Never Smoker  . Smokeless tobacco: Never Used  Substance and Sexual Activity  . Alcohol use: Yes    Alcohol/week: 2.0 standard drinks    Types: 2 Standard  drinks or equivalent per week  . Drug use: No  . Sexual activity: Yes    Partners: Male    Comment: Husband has Vasectomy  Lifestyle  . Physical activity:    Days per week: Not on file    Minutes per session: Not on file  . Stress: Not on file  Relationships  . Social connections:    Talks on phone: Not on file    Gets together: Not on file    Attends religious service: Not on file    Active member of club or organization: Not on file    Attends meetings of clubs or organizations: Not on file    Relationship status: Not on file  . Intimate partner violence:    Fear of current or ex partner: Not on file    Emotionally abused: Not on file    Physically abused: Not on file    Forced sexual activity: Not on file  Other Topics Concern  . Not on file  Social History Narrative  . Not on file    Review of Systems  Constitutional: Negative.   HENT: Negative.   Eyes: Negative.   Respiratory: Negative.   Cardiovascular: Negative.   Gastrointestinal: Positive for abdominal pain and diarrhea.  Genitourinary:       Pelvic pain  Breast pain  Pain or bleeding with intercourse Painful periods   Musculoskeletal: Positive for joint pain.  Skin: Negative.   Neurological: Negative.   Endo/Heme/Allergies: Negative.   Psychiatric/Behavioral: Negative.     PHYSICAL EXAMINATION:    BP (!) 102/54 (BP Location: Right Arm, Patient Position: Sitting, Cuff Size: Normal)   Pulse 78   Resp 14   Ht 5' 5.5" (1.664 m)   Wt 137 lb (62.1 kg)   LMP 07/30/2018   BMI 22.45 kg/m     General appearance: alert, cooperative and appears stated age Abdomen: soft, mildly tender in her RLQ, just under the level of the umbilicus. No rebound, no guarding; non distended, no masses,  no organomegaly  Pelvic: External genitalia:  no lesions              Urethra:  normal appearing urethra with no masses, tenderness or lesions              Bartholins and Skenes: normal                 Vagina: normal  appearing vagina with normal color and discharge, no lesions              Cervix: no cervical motion tenderness and no lesions              Bimanual Exam:  Uterus:  normal size, contour, position, consistency, mobility, non-tender              Adnexa: no mass, fullness, tenderness              Rectovaginal: Yes.  .  Confirms.              Anus:  normal sphincter tone, no lesions  Pelvic floor: not tender  Chaperone was present for exam.  Ultrasound images reviewed with the patient  ASSESSMENT Pelvic pain with ovulation, before and after her cycle and at random times Dysmenorrhea Dyspareunia    PLAN Normal ultrasound Genprobe Discussed options of OCP's, depo-provera, mirena IUD and laparoscopy Would like to try OCP's, no contraindications, risks reviewed.  Will start with loestrin 1.5/30, she had continued bleeding for 5 months on loestrin 1/20 in the past If tolerating, may decrease the dose F/U in 3 months   An After Visit Summary was printed and given to the patient.  ~25 minutes face to face time of which over 50% was spent in counseling.   CC: Evalee Mutton, CNM

## 2018-08-07 NOTE — Patient Instructions (Signed)
Oral Contraception Information Oral contraceptive pills (OCPs) are medicines taken to prevent pregnancy. OCPs work by preventing the ovaries from releasing eggs. The hormones in OCPs also cause the cervical mucus to thicken, preventing the sperm from entering the uterus. The hormones also cause the uterine lining to become thin, not allowing a fertilized egg to attach to the inside of the uterus. OCPs are highly effective when taken exactly as prescribed. However, OCPs do not prevent sexually transmitted diseases (STDs). Safe sex practices, such as using condoms along with the pill, can help prevent STDs. Before taking the pill, you may have a physical exam and Pap test. Your health care provider may order blood tests. The health care provider will make sure you are a good candidate for oral contraception. Discuss with your health care provider the possible side effects of the OCP you may be prescribed. When starting an OCP, it can take 2 to 3 months for the body to adjust to the changes in hormone levels in your body. Types of oral contraception  The combination pill-This pill contains estrogen and progestin (synthetic progesterone) hormones. The combination pill comes in 21-day, 28-day, or 91-day packs. Some types of combination pills are meant to be taken continuously (365-day pills). With 21-day packs, you do not take pills for 7 days after the last pill. With 28-day packs, the pill is taken every day. The last 7 pills are without hormones. Certain types of pills have more than 21 hormone-containing pills. With 91-day packs, the first 84 pills contain both hormones, and the last 7 pills contain no hormones or contain estrogen only.  The minipill-This pill contains the progesterone hormone only. The pill is taken every day continuously. It is very important to take the pill at the same time each day. The minipill comes in packs of 28 pills. All 28 pills contain the hormone. Advantages of oral  contraceptive pills  Decreases premenstrual symptoms.  Treats menstrual period cramps.  Regulates the menstrual cycle.  Decreases a heavy menstrual flow.  May treatacne, depending on the type of pill.  Treats abnormal uterine bleeding.  Treats polycystic ovarian syndrome.  Treats endometriosis.  Can be used as emergency contraception. Things that can make oral contraceptive pills less effective OCPs can be less effective if:  You forget to take the pill at the same time every day.  You have a stomach or intestinal disease that lessens the absorption of the pill.  You take OCPs with other medicines that make OCPs less effective, such as antibiotics, certain HIV medicines, and some seizure medicines.  You take expired OCPs.  You forget to restart the pill on day 7, when using the packs of 21 pills.  Risks associated with oral contraceptive pills Oral contraceptive pills can sometimes cause side effects, such as:  Headache.  Nausea.  Breast tenderness.  Irregular bleeding or spotting.  Combination pills are also associated with a small increased risk of:  Blood clots.  Heart attack.  Stroke.  This information is not intended to replace advice given to you by your health care provider. Make sure you discuss any questions you have with your health care provider. Document Released: 02/11/2003 Document Revised: 04/28/2016 Document Reviewed: 05/12/2013 Elsevier Interactive Patient Education  2018 Else[employer] Inc[employer]  

## 2018-08-08 LAB — GC/CHLAMYDIA PROBE AMP
Chlamydia trachomatis, NAA: NEGATIVE
Neisseria gonorrhoeae by PCR: NEGATIVE

## 2018-10-10 ENCOUNTER — Other Ambulatory Visit: Payer: Self-pay | Admitting: Obstetrics and Gynecology

## 2018-10-10 NOTE — Telephone Encounter (Signed)
Medication refill request: junel  Last AEX: 01/23/18 Next AEX: 02/26/19 but has a 3 month recheck 11/07/18 Last MMG (if hormonal medication request): NA Refill authorized: #1 pack with 0 RF to get her to her December appointment.

## 2018-10-24 ENCOUNTER — Other Ambulatory Visit: Payer: Self-pay | Admitting: Obstetrics and Gynecology

## 2018-11-05 NOTE — Progress Notes (Signed)
GYNECOLOGY  VISIT   HPI: 37 y.o.   Married White or Caucasian Not Hispanic or Latino  female   [phone] with Patient's last menstrual period was 10/29/2018 (exact date).   here for 3 month follow up on OCPs.    The patient was seen in 9/19 with pelvic pain with ovulation, pain before and after her cycle and at random times. She was also c/o dysmenorrhea and dyspareunia. She had a normal ultrasound and a negative genprobe.  She was started on loestrin 1.5/30 to see if that would help. Overall she feels better. Head to toe she feels better. Her pain gone while on the pill except an occasional twinge or aching between her vaginal and rectum and into her left hip. Much worse on the placebo pills, her tailbone is very tender that week.  Dyspareunia is gone. Breast tenderness is gone. Her diarrhea is better.  On the first 2 months months of the pill she had BTB the 3rd week, none the last month. Bleed for 3 days, light with her last cycle. She continues to have mild to moderate dyspareunia. Bleeding is so light she only needs a panty liner.   GYNECOLOGIC HISTORY: Patient's last menstrual period was 10/29/2018 (exact date). Contraception: Vasectomy, OCP Menopausal hormone therapy: None        OB History    Gravida  5   Para  3   Term  3   Preterm  0   AB  2   Living  3     SAB  2   TAB  0   Ectopic  0   Multiple  0   Live Births  3              Patient Active Problem List   Diagnosis Date Noted  . Routine general medical examination at a health care facility 05/18/2018  . Multinodular goiter (nontoxic) 05/02/2017  . Irritable bowel syndrome 01/03/2014    Past Medical History:  Diagnosis Date  . Blood in stool   . Chicken pox   . Enlarged thyroid   . Frequent headaches   . GERD (gastroesophageal reflux disease) 2003  . IBS (irritable bowel syndrome)   . UTI (urinary tract infection)     Past Surgical History:  Procedure Laterality Date  . DILATION AND CURETTAGE  OF UTERUS  06/2007  . [place]; Age 67    Current Outpatient Medications  Medication Sig Dispense Refill  . Cholecalciferol (VITAMIN D PO) Take 2,000 Int'l Units by mouth daily.    Lenda Kelp 1.5/30 1.5-30 MG-MCG tablet TAKE 1 TABLET BY MOUTH EVERY DAY 1 Package 0  . Magnesium Citrate 200 MG TABS Take 200 mg by mouth daily.    . Multiple Vitamins-Minerals (MULTIVITAMIN PO) Take 1 tablet by mouth daily.     . Turmeric 400 MG CAPS     . vitamin C (ASCORBIC ACID) 500 MG tablet Take 1 tablet once a day     No current facility-administered medications for this visit.      ALLERGIES: Codeine  Family History  Problem Relation Age of Onset  . Irritable bowel syndrome Father   . Hypertension Mother   . Spina bifida Sister   . Ulcerative colitis Paternal Grandmother   . Alcohol abuse Paternal Grandmother   . Diabetes Maternal Grandmother   . Stroke Maternal Grandmother   . Cancer Maternal Grandfather   . Hearing loss Paternal Grandfather     Social History  Socioeconomic History  . Marital status: Married    Spouse name: Not on file  . Number of children: 3  . Years of education: Not on file  . Highest education level: Not on file  Occupational History  . Occupation: home maker  Social Needs  . Financial resource strain: Not on file  . Food insecurity:    Worry: Not on file    Inability: Not on file  . Transportation needs:    Medical: Not on file    Non-medical: Not on file  Tobacco Use  . Smoking status: Never Smoker  . Smokeless tobacco: Never Used  Substance and Sexual Activity  . Alcohol use: Yes    Alcohol/week: 2.0 standard drinks    Types: 2 Standard drinks or equivalent per week  . Drug use: No  . Sexual activity: Yes    Partners: Male    Birth control/protection: Pill    Comment: Husband has Vasectomy  Lifestyle  . Physical activity:    Days per week: Not on file    Minutes per session: Not on file  . Stress: Not on file   Relationships  . Social connections:    Talks on phone: Not on file    Gets together: Not on file    Attends religious service: Not on file    Active member of club or organization: Not on file    Attends meetings of clubs or organizations: Not on file    Relationship status: Not on file  . Intimate partner violence:    Fear of current or ex partner: Not on file    Emotionally abused: Not on file    Physically abused: Not on file    Forced sexual activity: Not on file  Other Topics Concern  . Not on file  Social History Narrative  . Not on file    Review of Systems  Constitutional: Negative.   HENT: Negative.   Eyes: Negative.   Respiratory: Negative.   Cardiovascular: Negative.   Gastrointestinal: Negative.   Genitourinary: Negative.   Musculoskeletal: Negative.   Skin: Negative.   Neurological: Negative.   Endo/Heme/Allergies: Negative.   Psychiatric/Behavioral: Negative.     PHYSICAL EXAMINATION:    BP 110/62 (BP Location: Right Arm, Patient Position: Sitting, Cuff Size: Normal)   Pulse 68   Wt 137 lb 6.4 oz (62.3 kg)   LMP 10/29/2018 (Exact Date)   BMI 22.52 kg/m     General appearance: alert, cooperative and appears stated age   ASSESSMENT Pelvic pain, much better on OCP's Dyspareunia resolved.  Still having pain the placebo week, bleeding is very light.  Pain in her tailbone pain and left hip the week of the placebo week.  Patient with low vit d in the summer, started OTC vit d supplements   PLAN Will change to taking the pill continuously Call with significant BTB. If her tailbone pain or hip pain persist will refer to PT Vasectomy for contraception    An After Visit Summary was printed and given to the patient.  ~15 minutes face to face time of which over 50% was spent in counseling.

## 2018-11-07 ENCOUNTER — Other Ambulatory Visit: Payer: Self-pay

## 2018-11-07 ENCOUNTER — Encounter: Payer: Self-pay | Admitting: Obstetrics and Gynecology

## 2018-11-07 ENCOUNTER — Ambulatory Visit (INDEPENDENT_AMBULATORY_CARE_PROVIDER_SITE_OTHER): Payer: 59 | Admitting: Obstetrics and Gynecology

## 2018-11-07 VITALS — BP 110/62 | HR 68 | Wt 137.4 lb

## 2018-11-07 DIAGNOSIS — N941 Unspecified dyspareunia: Secondary | ICD-10-CM | POA: Diagnosis not present

## 2018-11-07 DIAGNOSIS — R102 Pelvic and perineal pain: Secondary | ICD-10-CM

## 2018-11-07 DIAGNOSIS — N946 Dysmenorrhea, unspecified: Secondary | ICD-10-CM

## 2018-11-07 MED ORDER — NORETHINDRONE ACET-ETHINYL EST 1.5-30 MG-MCG PO TABS: 1.0000 | ORAL_TABLET | Freq: Every day | ORAL | 1 refills | Status: DC

## 2018-12-12 ENCOUNTER — Encounter: Payer: Self-pay | Admitting: Internal Medicine

## 2018-12-12 ENCOUNTER — Ambulatory Visit (INDEPENDENT_AMBULATORY_CARE_PROVIDER_SITE_OTHER)
Admission: RE | Admit: 2018-12-12 | Discharge: 2018-12-12 | Disposition: A | Discharge status: Home / Self Care | Payer: 59 | Source: Ambulatory Visit | Attending: Internal Medicine | Admitting: Internal Medicine

## 2018-12-12 ENCOUNTER — Ambulatory Visit: Payer: 59 | Admitting: Internal Medicine

## 2018-12-12 VITALS — BP 112/70 | HR 74 | Temp 98.3°F | Ht 65.5 in | Wt 140.0 lb

## 2018-12-12 DIAGNOSIS — M5442 Lumbago with sciatica, left side: Secondary | ICD-10-CM

## 2018-12-12 DIAGNOSIS — G8929 Other chronic pain: Secondary | ICD-10-CM | POA: Diagnosis not present

## 2018-12-12 DIAGNOSIS — M25552 Pain in left hip: Secondary | ICD-10-CM | POA: Diagnosis not present

## 2018-12-12 DIAGNOSIS — M549 Dorsalgia, unspecified: Secondary | ICD-10-CM | POA: Diagnosis not present

## 2018-12-12 NOTE — Progress Notes (Signed)
   Subjective:   Patient ID: Sherry Chavez, female    DOB: 1981/10/18, 38 y.o.   MRN: [id number]  HPI The patient is a 38 YO female coming in for left hip and low tailbone pain. Started about 5-6 months ago with mild pain. No injury or overuse or falls at the onset. No old or recent car accidents. She does have the pain most of the time kind of aching and feels very deep almost behind the bones. She does get some pain when she needs to defecate which is relieved with defecation. She has some worse pain and symptoms around ovulation and start of cycle. Talked to gyn about this and is now on continuous birth control which has helped slightly. She takes advil for it most days which does help take the edge off but does not relieve the pain. Pain does radiate into the left thigh region. No numbness or weakness. No loss of bowel or bladder. Denies blood in stool. Mother with history of bad endometriosis with surgery on bowels with partial colectomy due to severe endometriosis.   Review of Systems  Constitutional: Negative.   HENT: Negative.   Eyes: Negative.   Respiratory: Negative for cough, chest tightness and shortness of breath.   Cardiovascular: Negative for chest pain, palpitations and leg swelling.  Gastrointestinal: Negative for abdominal distention, abdominal pain, constipation, diarrhea, nausea and vomiting.  Musculoskeletal: Positive for arthralgias, back pain and myalgias.  Skin: Negative.   Neurological: Negative.   Psychiatric/Behavioral: Negative.     Objective:  Physical Exam Constitutional:      Appearance: She is well-developed.  HENT:     Head: Normocephalic and atraumatic.  Neck:     Musculoskeletal: Normal range of motion.  Cardiovascular:     Rate and Rhythm: Normal rate and regular rhythm.  Pulmonary:     Effort: Pulmonary effort is normal. No respiratory distress.     Breath sounds: Normal breath sounds. No wheezing or rales.  Abdominal:     General: Bowel  sounds are normal. There is no distension.     Palpations: Abdomen is soft.     Tenderness: There is no abdominal tenderness. There is no rebound.  Musculoskeletal:     Comments: No true tenderness to palpation but pain in the SI region left and midline lumbar/sacral   Skin:    General: Skin is warm and dry.  Neurological:     Mental Status: She is alert and oriented to person, place, and time.     Coordination: Coordination normal.     Vitals:   12/12/18 1035  BP: 112/70  Pulse: 74  Temp: 98.3 F (36.8 C)  TempSrc: Oral  SpO2: 99%  Weight: 140 lb (63.5 kg)  Height: 5' 5.5" (1.664 m)    Assessment & Plan:

## 2018-12-12 NOTE — Patient Instructions (Signed)
We will check the x-ray today and call you back about the results.   If we do not find anything to cause this we will have you see the sports medicine specialist to see if he can adjust this to help.

## 2018-12-13 DIAGNOSIS — G8929 Other chronic pain: Secondary | ICD-10-CM | POA: Insufficient documentation

## 2018-12-13 DIAGNOSIS — M5442 Lumbago with sciatica, left side: Principal | ICD-10-CM

## 2018-12-13 NOTE — Assessment & Plan Note (Addendum)
Checking x-ray low back and hip/pelvis. Sounds suspicious for endometriosis but need to rule out SI dysfunction or spinal problems. If x-ray normal will refer to sports medicine for manipulation to see if this improves pain.

## 2019-01-17 ENCOUNTER — Encounter: Payer: Self-pay | Admitting: Family Medicine

## 2019-01-17 ENCOUNTER — Ambulatory Visit: Payer: 59 | Admitting: Family Medicine

## 2019-01-17 VITALS — BP 112/78 | HR 77 | Temp 98.1°F | Ht 65.5 in | Wt 140.4 lb

## 2019-01-17 DIAGNOSIS — J020 Streptococcal pharyngitis: Secondary | ICD-10-CM | POA: Diagnosis not present

## 2019-01-17 DIAGNOSIS — J029 Acute pharyngitis, unspecified: Secondary | ICD-10-CM | POA: Diagnosis not present

## 2019-01-17 LAB — POCT RAPID STREP A (OFFICE): RAPID STREP A SCREEN: POSITIVE — AB

## 2019-01-17 MED ORDER — AMOXICILLIN 500 MG PO CAPS: 500.0000 mg | ORAL_CAPSULE | Freq: Two times a day (BID) | ORAL | 0 refills | Status: DC

## 2019-01-17 NOTE — Progress Notes (Signed)
Patient ID: Sherry Chavez, female   DOB: 06/23/1981, 38 y.o.   MRN: [id number]  PCP: Hoyt Koch, MD  Subjective:  Sherry Chavez is a 38 y.o. year old very pleasant female patient who presents with symptoms including sore throat, and HA with mild nausea -started: one week ago, improved, but has returned two days ago and  symptoms are not improving -previous treatments: Ibuprofen has provided limited benefit -sick contacts/travel/risks: denies flu exposure. Recent sick contact exposure with 3 children that are strep positive. -Hx of: mild seasonal allergies No recent antibiotic use.   ROS-denies fever, SOB, NVD, tooth pain  Pertinent Past Medical History- mild seasonal allergies  Medications- reviewed  Current Outpatient Medications  Medication Sig Dispense Refill  . Cholecalciferol (VITAMIN D PO) Take 2,000 Int'l Units by mouth daily.    . Magnesium Citrate 200 MG TABS Take 200 mg by mouth daily.    . Multiple Vitamins-Minerals (MULTIVITAMIN PO) Take 1 tablet by mouth daily.     . Norethindrone Acetate-Ethinyl Estradiol (JUNEL 1.5/30) 1.5-30 MG-MCG tablet Take 1 tablet by mouth daily. Take continuously, skip the placebo week. 4 Package 1  . Turmeric 400 MG CAPS     . vitamin C (ASCORBIC ACID) 500 MG tablet Take 1 tablet once a day     No current facility-administered medications for this visit.     Objective: BP 112/78 (BP Location: Left Arm, Patient Position: Sitting, Cuff Size: Normal)   Pulse 77   Temp 98.1 F (36.7 C) (Oral)   Ht 5' 5.5" (1.664 m)   Wt 140 lb 6.4 oz (63.7 kg)   SpO2 99%   BMI 23.01 kg/m  Gen: NAD, resting comfortably HEENT: Turbinates mildly erythematous, TMs normal bilaterally, pharynx erythematous with exudate or edema, no sinus tenderness CV: RRR no murmurs rubs or gallops Lungs: CTAB no crackles, wheeze, rhonchi Ext: no edema Lymph: + anterior cervical lymphadenopathy  Skin: warm, dry, no rash Neuro: grossly normal, moves all  extremities  Assessment/Plan: 1. Strep throat Rapid strep is positive; treat with amoxicillin; advised patient on supportive measures:  Get rest, drink plenty of fluids, and use tylenol or ibuprofen as needed for pain. Follow up if fever >101, if symptoms worsen or if symptoms are not improved in 3 days. Further advised her to change her toothbrush after 48 to 72  hours of antibiotic therapy. Return precautions provided.    - amoxicillin (AMOXIL) 500 MG capsule; Take 1 capsule (500 mg total) by mouth 2 (two) times daily.  Dispense: 20 capsule; Refill: 0  2. Sore throat  - POCT rapid strep A   Finally, we reviewed reasons to return to care including if symptoms worsen or persist or new concerns arise- once again particularly shortness of breath or fever.  Laurita Quint, FNP

## 2019-01-17 NOTE — Patient Instructions (Signed)
It was a pleasure to meet you today!    Feel better soon!  Please take medication as directed.  Please drink plenty of water so that your urine is pale yellow or clear. Also, get plenty of rest, use tylenol or ibuprofen as needed for discomfort and follow up if symptoms do not improve in 3 to 4 days, worsen, or you develop a fever >101. Strep Throat  Strep throat is an infection of the throat. It is caused by germs. Strep throat spreads from person to person because of coughing, sneezing, or close contact. Follow these instructions at home: Medicines  Take over-the-counter and prescription medicines only as told by your doctor.  Take your antibiotic medicine as told by your doctor. Do not stop taking the medicine even if you feel better.  Have family members who also have a sore throat or fever go to a doctor. Eating and drinking  Do not share food, drinking cups, or personal items.  Try eating soft foods until your sore throat feels better.  Drink enough fluid to keep your pee (urine) clear or pale yellow. General instructions  Rinse your mouth (gargle) with a salt-water mixture 3-4 times per day or as needed. To make a salt-water mixture, stir -1 tsp of salt into 1 cup of warm water.  Make sure that all people in your house wash their hands well.  Rest.  Stay home from school or work until you have been taking antibiotics for 24 hours.  Keep all follow-up visits as told by your doctor. This is important. Contact a doctor if:  Your neck keeps getting bigger.  You get a rash, cough, or earache.  You cough up thick liquid that is green, yellow-brown, or bloody.  You have pain that does not get better with medicine.  Your problems get worse instead of getting better.  You have a fever. Get help right away if:  You throw up (vomit).  You get a very bad headache.  You neck hurts or it feels stiff.  You have chest pain or you are short of breath.  You have  drooling, very bad throat pain, or changes in your voice.  Your neck is swollen or the skin gets red and tender.  Your mouth is dry or you are peeing less than normal.  You keep feeling more tired or it is hard to wake up.  Your joints are red or they hurt. This information is not intended to replace advice given to you by your health care provider. Make sure you discuss any questions you have with your health care provider. Document Released: 05/09/2008 Document Revised: 07/20/2016 Document Reviewed: 03/16/2015 Elsevier Interactive Patient Education  [employer].

## 2019-02-05 NOTE — Progress Notes (Signed)
38 y.o. [id number] Married White or Caucasian Not Hispanic or Latino female here for annual exam.   Period Duration (Days): 3 days Period Pattern: (!) Irregular(due to continuous OCP) Menstrual Flow: Light Menstrual Control: Thin pad, Tampon Dysmenorrhea: None  The patient was seen in the fall with pelvic pain. She had a normal pelvic exam, negative genprobe and normal ultrasound. She was started on OCP's which helped, in 12/19 she was changed to continuous OCP's. She is doing well on continuous OCP's, only one episode of spotting in the last 3 months.  Pain is better, no dyspareunia. She has IBS, better overall.    Patient's last menstrual period was 01/24/2019 (exact date).          Sexually active: Yes.    The current method of family planning is OCP (estrogen/progesterone).    Exercising: Yes.    biking, running Smoker:  no  Health Maintenance: Pap:  01-16-15 neg, 01-20-17 neg HPV HR neg History of Abnormal Pap: no MMG:  06-10-14 category c density birads 1:neg Colonoscopy:  2013 normal age 10 BMD:   none TDaP:  2018 Gardasil: no, discussed.     reports that she has never smoked. She has never used smokeless tobacco. She reports current alcohol use of about 3.0 standard drinks of alcohol per week. She reports that she does not use drugs. Kids are 6,9 and 10 (all girls). Homemaker, she home schools.   Past Medical History:  Diagnosis Date  . Blood in stool   . Chicken pox   . Enlarged thyroid   . Frequent headaches   . GERD (gastroesophageal reflux disease) 2003  . IBS (irritable bowel syndrome)   . UTI (urinary tract infection)     Past Surgical History:  Procedure Laterality Date  . DILATION AND CURETTAGE OF UTERUS  06/2007  . [place]; Age 89    Current Outpatient Medications  Medication Sig Dispense Refill  . Cholecalciferol (VITAMIN D PO) Take 2,000 Int'l Units by mouth daily.    . Magnesium Citrate 200 MG TABS Take 200 mg by mouth daily.     . Multiple Vitamins-Minerals (MULTIVITAMIN PO) Take 1 tablet by mouth daily.     . Norethindrone Acetate-Ethinyl Estradiol (JUNEL 1.5/30) 1.5-30 MG-MCG tablet Take 1 tablet by mouth daily. Take continuously, skip the placebo week. 4 Package 1  . Turmeric 400 MG CAPS     . vitamin C (ASCORBIC ACID) 500 MG tablet Take 1 tablet once a day     No current facility-administered medications for this visit.     Family History  Problem Relation Age of Onset  . Irritable bowel syndrome Father   . Hypertension Mother   . Spina bifida Sister   . Ulcerative colitis Paternal Grandmother   . Alcohol abuse Paternal Grandmother   . Diabetes Maternal Grandmother   . Stroke Maternal Grandmother   . Cancer Maternal Grandfather   . Hearing loss Paternal Grandfather     Review of Systems  Constitutional: Negative.   HENT: Negative.   Eyes: Negative.   Respiratory: Negative.   Cardiovascular: Negative.   Gastrointestinal: Negative.   Endocrine: Negative.   Genitourinary: Negative.   Musculoskeletal: Negative.   Skin: Negative.   Allergic/Immunologic: Negative.   Neurological: Negative.   Hematological: Negative.   Psychiatric/Behavioral: Negative.     Exam:   BP (!) 108/58 (BP Location: Right Arm, Patient Position: Sitting, Cuff Size: Normal)   Pulse 72   Ht 5\' 5"  (1.651 m)  Wt 139 lb 3.2 oz (63.1 kg)   LMP 01/24/2019 (Exact Date)   BMI 23.16 kg/m   Weight change: @WEIGHTCHANGE @ Height:   Height: 5\' 5"  (165.1 cm)  Ht Readings from Last 3 Encounters:  02/06/19 5\' 5"  (1.651 m)  01/17/19 5' 5.5" (1.664 m)  12/12/18 5' 5.5" (1.664 m)    General appearance: alert, cooperative and appears stated age Head: Normocephalic, without obvious abnormality, atraumatic Neck: no adenopathy, supple, symmetrical, trachea midline and thyroid thyroid top normal sized Lungs: clear to auscultation bilaterally Cardiovascular: regular rate and rhythm Breasts: normal appearance, no masses or  tenderness Abdomen: soft, non-tender; non distended,  no masses,  no organomegaly Extremities: extremities normal, atraumatic, no cyanosis or edema Skin: Skin color, texture, turgor normal. No rashes or lesions Lymph nodes: Cervical, supraclavicular, and axillary nodes normal. No abnormal inguinal nodes palpated Neurologic: Grossly normal   Pelvic: External genitalia:  no lesions              Urethra:  normal appearing urethra with no masses, tenderness or lesions              Bartholins and Skenes: normal                 Vagina: normal appearing vagina with normal color and discharge, no lesions              Cervix: no lesions               Bimanual Exam:  Uterus:  normal size, contour, position, consistency, mobility, non-tender              Adnexa: no mass, fullness, tenderness               Rectovaginal: Confirms               Anus:  normal sphincter tone, no lesions  Chaperone was present for exam.  A:  Well Woman with normal exam  She has an enlarged thyroid, has seen Endocrinology, is supposed to have yearly TFT's  H/O low WBC  Vit d def  H/O pelvic pain, improved on continuous OCP's    P:   No pap this year  Continue OCP's (continuously)  Discussed breast self exam  Discussed calcium and vit D intake   Labs last June with primary  Will check CBC, vit d and TFT's today

## 2019-02-06 ENCOUNTER — Ambulatory Visit: Payer: 59 | Admitting: Certified Nurse Midwife

## 2019-02-06 ENCOUNTER — Ambulatory Visit: Payer: 59 | Admitting: Obstetrics and Gynecology

## 2019-02-06 ENCOUNTER — Other Ambulatory Visit: Payer: Self-pay

## 2019-02-06 ENCOUNTER — Encounter: Payer: Self-pay | Admitting: Obstetrics and Gynecology

## 2019-02-06 VITALS — BP 108/58 | HR 72 | Ht 65.0 in | Wt 139.2 lb

## 2019-02-06 DIAGNOSIS — E559 Vitamin D deficiency, unspecified: Secondary | ICD-10-CM | POA: Diagnosis not present

## 2019-02-06 DIAGNOSIS — Z0000 Encounter for general adult medical examination without abnormal findings: Secondary | ICD-10-CM | POA: Diagnosis not present

## 2019-02-06 DIAGNOSIS — E049 Nontoxic goiter, unspecified: Secondary | ICD-10-CM | POA: Diagnosis not present

## 2019-02-06 DIAGNOSIS — D72819 Decreased white blood cell count, unspecified: Secondary | ICD-10-CM | POA: Diagnosis not present

## 2019-02-06 DIAGNOSIS — Z01419 Encounter for gynecological examination (general) (routine) without abnormal findings: Secondary | ICD-10-CM | POA: Diagnosis not present

## 2019-02-06 MED ORDER — NORETHINDRONE ACET-ETHINYL EST 1.5-30 MG-MCG PO TABS: 1.0000 | ORAL_TABLET | Freq: Every day | ORAL | 3 refills | Status: DC

## 2019-02-06 NOTE — Patient Instructions (Signed)
EXERCISE AND DIET:  We recommended that you start or continue a regular exercise program for good health. Regular exercise means any activity that makes your heart beat faster and makes you sweat.  We recommend exercising at least 30 minutes per day at least 3 days a week, preferably 4 or 5.  We also recommend a diet low in fat and sugar.  Inactivity, poor dietary choices and obesity can cause diabetes, heart attack, stroke, and kidney damage, among others.    ALCOHOL AND SMOKING:  Women should limit their alcohol intake to no more than 7 drinks/beers/glasses of wine (combined, not each!) per week. Moderation of alcohol intake to this level decreases your risk of breast cancer and liver damage. And of course, no recreational drugs are part of a healthy lifestyle.  And absolutely no smoking or even second hand smoke. Most people know smoking can cause heart and lung diseases, but did you know it also contributes to weakening of your bones? Aging of your skin?  Yellowing of your teeth and nails?  CALCIUM AND VITAMIN D:  Adequate intake of calcium and Vitamin D are recommended.  The recommendations for exact amounts of these supplements seem to change often, but generally speaking 1,000 mg of calcium (between diet and supplement) and 800 units of Vitamin D per day seems prudent. Certain women may benefit from higher intake of Vitamin D.  If you are among these women, your doctor will have told you during your visit.    PAP SMEARS:  Pap smears, to check for cervical cancer or precancers,  have traditionally been done yearly, although recent scientific advances have shown that most women can have pap smears less often.  However, every woman still should have a physical exam from her gynecologist every year. It will include a breast check, inspection of the vulva and vagina to check for abnormal growths or skin changes, a visual exam of the cervix, and then an exam to evaluate the size and shape of the uterus and  ovaries.  And after 37 years of age, a rectal exam is indicated to check for rectal cancers. We will also provide age appropriate advice regarding health maintenance, like when you should have certain vaccines, screening for sexually transmitted diseases, bone density testing, colonoscopy, mammograms, etc.   MAMMOGRAMS:  All women over 40 years old should have a yearly mammogram. Many facilities now offer a "3D" mammogram, which may cost around $50 extra out of pocket. If possible,  we recommend you accept the option to have the 3D mammogram performed.  It both reduces the number of women who will be called back for extra views which then turn out to be normal, and it is better than the routine mammogram at detecting truly abnormal areas.    COLON CANCER SCREENING: Now recommend starting at age 45. At this time colonoscopy is not covered for routine screening until 50. There are take home tests that can be done between 45-49.   COLONOSCOPY:  Colonoscopy to screen for colon cancer is recommended for all women at age 50.  We know, you hate the idea of the prep.  We agree, BUT, having colon cancer and not knowing it is worse!!  Colon cancer so often starts as a polyp that can be seen and removed at colonscopy, which can quite literally save your life!  And if your first colonoscopy is normal and you have no family history of colon cancer, most women don't have to have it again for   10 years.  Once every ten years, you can do something that may end up saving your life, right?  We will be happy to help you get it scheduled when you are ready.  Be sure to check your insurance coverage so you understand how much it will cost.  It may be covered as a preventative service at no cost, but you should check your particular policy.      Breast Self-Awareness Breast self-awareness means being familiar with how your breasts look and feel. It involves checking your breasts regularly and reporting any changes to your  health care provider. Practicing breast self-awareness is important. A change in your breasts can be a sign of a serious medical problem. Being familiar with how your breasts look and feel allows you to find any problems early, when treatment is more likely to be successful. All women should practice breast self-awareness, including women who have had breast implants. How to do a breast self-exam One way to learn what is normal for your breasts and whether your breasts are changing is to do a breast self-exam. To do a breast self-exam: Look for Changes  1. Remove all the clothing above your waist. 2. Stand in front of a mirror in a room with good lighting. 3. Put your hands on your hips. 4. Push your hands firmly downward. 5. Compare your breasts in the mirror. Look for differences between them (asymmetry), such as: ? Differences in shape. ? Differences in size. ? Puckers, dips, and bumps in one breast and not the other. 6. Look at each breast for changes in your skin, such as: ? Redness. ? Scaly areas. 7. Look for changes in your nipples, such as: ? Discharge. ? Bleeding. ? Dimpling. ? Redness. ? A change in position. Feel for Changes Carefully feel your breasts for lumps and changes. It is best to do this while lying on your back on the floor and again while sitting or standing in the shower or tub with soapy water on your skin. Feel each breast in the following way:  Place the arm on the side of the breast you are examining above your head.  Feel your breast with the other hand.  Start in the nipple area and make  inch (2 cm) overlapping circles to feel your breast. Use the pads of your three middle fingers to do this. Apply light pressure, then medium pressure, then firm pressure. The light pressure will allow you to feel the tissue closest to the skin. The medium pressure will allow you to feel the tissue that is a little deeper. The firm pressure will allow you to feel the tissue  close to the ribs.  Continue the overlapping circles, moving downward over the breast until you feel your ribs below your breast.  Move one finger-width toward the center of the body. Continue to use the  inch (2 cm) overlapping circles to feel your breast as you move slowly up toward your collarbone.  Continue the up and down exam using all three pressures until you reach your armpit.  Write Down What You Find  Write down what is normal for each breast and any changes that you find. Keep a written record with breast changes or normal findings for each breast. By writing this information down, you do not need to depend only on memory for size, tenderness, or location. Write down where you are in your menstrual cycle, if you are still menstruating. If you are having trouble noticing differences   in your breasts, do not get discouraged. With time you will become more familiar with the variations in your breasts and more comfortable with the exam. How often should I examine my breasts? Examine your breasts every month. If you are breastfeeding, the best time to examine your breasts is after a feeding or after using a breast pump. If you menstruate, the best time to examine your breasts is 5-7 days after your period is over. During your period, your breasts are lumpier, and it may be more difficult to notice changes. When should I see my health care provider? See your health care provider if you notice:  A change in shape or size of your breasts or nipples.  A change in the skin of your breast or nipples, such as a reddened or scaly area.  Unusual discharge from your nipples.  A lump or thick area that was not there before.  Pain in your breasts.  Anything that concerns you.  

## 2019-02-07 LAB — THYROID PANEL WITH TSH
Free Thyroxine Index: 2.2 (ref 1.2–4.9)
T3 UPTAKE RATIO: 24 % (ref 24–39)
T4, Total: 9.3 ug/dL (ref 4.5–12.0)
TSH: 1.09 u[IU]/mL (ref 0.450–4.500)

## 2019-02-07 LAB — CBC
Hematocrit: 43.1 % (ref 34.0–46.6)
Hemoglobin: 13.8 g/dL (ref 11.1–15.9)
MCH: 29.5 pg (ref 26.6–33.0)
MCHC: 32 g/dL (ref 31.5–35.7)
MCV: 92 fL (ref 79–97)
PLATELETS: 277 10*3/uL (ref 150–450)
RBC: 4.68 x10E6/uL (ref 3.77–5.28)
RDW: 12 % (ref 11.7–15.4)
WBC: 6.8 10*3/uL (ref 3.4–10.8)

## 2019-02-07 LAB — VITAMIN D 25 HYDROXY (VIT D DEFICIENCY, FRACTURES): Vit D, 25-Hydroxy: 39.2 ng/mL (ref 30.0–100.0)

## 2019-07-02 ENCOUNTER — Ambulatory Visit (INDEPENDENT_AMBULATORY_CARE_PROVIDER_SITE_OTHER): Payer: 59 | Admitting: Internal Medicine

## 2019-07-02 ENCOUNTER — Encounter: Payer: Self-pay | Admitting: Internal Medicine

## 2019-07-02 DIAGNOSIS — R197 Diarrhea, unspecified: Secondary | ICD-10-CM | POA: Insufficient documentation

## 2019-07-02 MED ORDER — METRONIDAZOLE 500 MG PO TABS: 500.0000 mg | ORAL_TABLET | Freq: Two times a day (BID) | ORAL | 0 refills | Status: DC

## 2019-07-02 NOTE — Assessment & Plan Note (Signed)
Ordered covid-19 testing as she has diarrhea. This is ongoing for about 14 days at this time making viral or food bourne less likely. She does not have any high risk factors for C dif and last antibiotic February. Will cover for giardia given recent camping with metronidazole 7 day course. If no improvement in 2 days may need to alter treatment course. If covid-19 testing negative can do GI pathogen panel.

## 2019-07-02 NOTE — Progress Notes (Signed)
Virtual Visit via Video Note  I connected with Sherry Chavez on 07/02/19 at 10:00 AM EDT by a video enabled telemedicine application and verified that I am speaking with the correct person using two identifiers.  The patient and the provider were at separate locations throughout the entire encounter.   I discussed the limitations of evaluation and management by telemedicine and the availability of in person appointments. The patient expressed understanding and agreed to proceed.  History of Present Illness: The patient is a 38 y.o. female with visit for diarrhea. Started about 2 weeks ago on a Wednesday morning. The weekend prior to this they did go on a camping trip to the [place]. She was in stream and fishing and got some water in her mouth but did not intentionally drink any water from stream. She denies much contact with others. No sinus pressure or drainage. No sore throat or cough. Did have fever first day but not since and denies chills. Some fatigue and aching as the diarrhea has gone on. 6-10 times per day mostly watery. Some mild crampy pain intermittent in her stomach. Denies blood in BM. Denies vomiting but mild nausea and lack of appetite. She has tried pepto bismol and imodium without any relief. Overall mild improvement but not significant. Denies any new foods and no sick family members. No illnesses or change in meds or antibiotics in the last 3 months. Last antibiotic was in February for strep.  Observations/Objective: Appearance: normal, breathing appears normal, casual grooming, abdomen does not appear distended, not sore to self palpation, throat normal, mental status is A and O times 3  Assessment and Plan: See problem oriented charting  Follow Up Instructions: push fluids, ordered covid-19 testing, rx metronidazole to cover for giardia given recent camping trip, if covid-19 testing negative needs GI pathogen panel of stool if no relief with metronidazole to isolate cause, low  risk for C dif  I discussed the assessment and treatment plan with the patient. The patient was provided an opportunity to ask questions and all were answered. The patient agreed with the plan and demonstrated an understanding of the instructions.   The patient was advised to call back or seek an in-person evaluation if the symptoms worsen or if the condition fails to improve as anticipated.  Hoyt Koch, MD

## 2019-07-05 ENCOUNTER — Other Ambulatory Visit: Payer: 59

## 2019-07-05 ENCOUNTER — Other Ambulatory Visit: Payer: Self-pay

## 2019-07-05 DIAGNOSIS — Z20822 Contact with and (suspected) exposure to covid-19: Secondary | ICD-10-CM

## 2019-07-07 LAB — NOVEL CORONAVIRUS, NAA: SARS-CoV-2, NAA: NOT DETECTED

## 2019-07-08 ENCOUNTER — Encounter: Payer: Self-pay | Admitting: Internal Medicine

## 2019-07-08 ENCOUNTER — Other Ambulatory Visit: Payer: Self-pay | Admitting: Internal Medicine

## 2019-07-08 DIAGNOSIS — R197 Diarrhea, unspecified: Secondary | ICD-10-CM

## 2019-07-09 ENCOUNTER — Other Ambulatory Visit (INDEPENDENT_AMBULATORY_CARE_PROVIDER_SITE_OTHER): Payer: 59

## 2019-07-09 DIAGNOSIS — R197 Diarrhea, unspecified: Secondary | ICD-10-CM | POA: Diagnosis not present

## 2019-07-09 LAB — CBC
HCT: 42.7 % (ref 36.0–46.0)
Hemoglobin: 14.3 g/dL (ref 12.0–15.0)
MCHC: 33.6 g/dL (ref 30.0–36.0)
MCV: 90.1 fl (ref 78.0–100.0)
Platelets: 286 10*3/uL (ref 150.0–400.0)
RBC: 4.74 Mil/uL (ref 3.87–5.11)
RDW: 13 % (ref 11.5–15.5)
WBC: 6.3 10*3/uL (ref 4.0–10.5)

## 2019-07-09 LAB — COMPREHENSIVE METABOLIC PANEL
ALT: 13 U/L (ref 0–35)
AST: 12 U/L (ref 0–37)
Albumin: 4.7 g/dL (ref 3.5–5.2)
Alkaline Phosphatase: 33 U/L — ABNORMAL LOW (ref 39–117)
BUN: 9 mg/dL (ref 6–23)
CO2: 28 mEq/L (ref 19–32)
Calcium: 9.4 mg/dL (ref 8.4–10.5)
Chloride: 103 mEq/L (ref 96–112)
Creatinine, Ser: 0.64 mg/dL (ref 0.40–1.20)
GFR: 104.58 mL/min (ref >60.00)
Glucose, Bld: 95 mg/dL (ref 70–99)
Potassium: 4.3 mEq/L (ref 3.5–5.1)
Sodium: 138 mEq/L (ref 135–145)
Total Bilirubin: 0.3 mg/dL (ref 0.2–1.2)
Total Protein: 7.3 g/dL (ref 6.0–8.3)

## 2019-07-09 LAB — LIPASE: Lipase: 40 U/L (ref 11.0–59.0)

## 2019-07-10 ENCOUNTER — Other Ambulatory Visit: Payer: 59

## 2019-07-10 ENCOUNTER — Encounter: Payer: Self-pay | Admitting: Internal Medicine

## 2019-07-10 DIAGNOSIS — R197 Diarrhea, unspecified: Secondary | ICD-10-CM

## 2019-07-12 LAB — GASTROINTESTINAL PATHOGEN PANEL PCR
C. difficile Tox A/B, PCR: NOT DETECTED
Campylobacter, PCR: NOT DETECTED
Cryptosporidium, PCR: NOT DETECTED
E coli (ETEC) LT/ST PCR: NOT DETECTED
E coli (STEC) stx1/stx2, PCR: NOT DETECTED
E coli 0157, PCR: NOT DETECTED
Giardia lamblia, PCR: NOT DETECTED
Norovirus, PCR: NOT DETECTED
Rotavirus A, PCR: NOT DETECTED
Salmonella, PCR: NOT DETECTED
Shigella, PCR: NOT DETECTED

## 2019-07-16 ENCOUNTER — Ambulatory Visit (INDEPENDENT_AMBULATORY_CARE_PROVIDER_SITE_OTHER): Payer: 59 | Admitting: Internal Medicine

## 2019-07-16 ENCOUNTER — Encounter: Payer: Self-pay | Admitting: Internal Medicine

## 2019-07-16 ENCOUNTER — Inpatient Hospital Stay: Admission: RE | Admit: 2019-07-16 | Payer: 59 | Source: Ambulatory Visit

## 2019-07-16 DIAGNOSIS — R197 Diarrhea, unspecified: Secondary | ICD-10-CM

## 2019-07-16 DIAGNOSIS — R1084 Generalized abdominal pain: Secondary | ICD-10-CM

## 2019-07-16 NOTE — Assessment & Plan Note (Signed)
Checking CT abdomen and pelvis. Given sample xifaxan to see if this is IBS flare or bacterial overgrowth.

## 2019-07-16 NOTE — Progress Notes (Signed)
Virtual Visit via Video Note  I connected with Sherry Chavez on 07/16/19 at  9:40 AM EDT by a video enabled telemedicine application and verified that I am speaking with the correct person using two identifiers.  The patient and the provider were at separate locations throughout the entire encounter.   I discussed the limitations of evaluation and management by telemedicine and the availability of in person appointments. The patient expressed understanding and agreed to proceed.  History of Present Illness: The patient is a 38 y.o. female with visit for ongoing diarrhea, gas, bloating. Started about 2-3 weeks ago. Prior IBS but has never had problems like this. She was going diarrhea numerous times per day. Usually just liquid and partially digested food. Denies fevers or chills. Given pandemic we did screen for covid-19 at the onset which was negative. We then did labs and stool study which were not revealing. She had been on camping trip prior to onset so we treated empirically with flagyl to cover giardia but this did not help. She does not eat gluten or dairy and diet had not changed prior to onset. She has been taking imodium daily which helps a little. She is having 4-5 BM right after awakening for the first hour. She then is okay the rest of the day until the evening. She is waking up multiple times at night to go to bathroom. Has stable weight. Is still eating and drinking okay now. Denies fevers or chills. Denies significant abdominal pain. Overall it is not improving at all. Has tried imodium and flagyl  Observations/Objective: Appearance: normal, breathing appears normal, casual grooming, abdomen does not appear distended, throat normal, memory normal, mental status is A and O times 3  Assessment and Plan: See problem oriented charting  Follow Up Instructions: CT abdomen and pelvis and try sample xifaxan for 1 week TID to see if this is IBS flare  I discussed the assessment and  treatment plan with the patient. The patient was provided an opportunity to ask questions and all were answered. The patient agreed with the plan and demonstrated an understanding of the instructions.   The patient was advised to call back or seek an in-person evaluation if the symptoms worsen or if the condition fails to improve as anticipated.  Hoyt Koch, MD

## 2019-07-24 ENCOUNTER — Ambulatory Visit
Admission: RE | Admit: 2019-07-24 | Discharge: 2019-07-24 | Disposition: A | Discharge status: Home / Self Care | Payer: 59 | Source: Ambulatory Visit | Attending: Internal Medicine | Admitting: Internal Medicine

## 2019-07-24 DIAGNOSIS — R1084 Generalized abdominal pain: Secondary | ICD-10-CM

## 2019-07-24 MED ORDER — IOPAMIDOL (ISOVUE-300) INJECTION 61%
100.0000 mL | Freq: Once | INTRAVENOUS | Status: AC | PRN
  Administered 2019-07-24: 100 mL via INTRAVENOUS

## 2019-07-29 ENCOUNTER — Encounter: Payer: Self-pay | Admitting: Internal Medicine

## 2019-08-06 ENCOUNTER — Other Ambulatory Visit: Payer: 59

## 2019-11-19 ENCOUNTER — Emergency Department (HOSPITAL_COMMUNITY)
Admission: EM | Admit: 2019-11-19 | Discharge: 2019-11-19 | Disposition: A | Discharge status: Home / Self Care | Payer: 59 | Attending: Emergency Medicine | Admitting: Emergency Medicine

## 2019-11-19 ENCOUNTER — Other Ambulatory Visit: Payer: Self-pay

## 2019-11-19 ENCOUNTER — Ambulatory Visit
Admission: EM | Admit: 2019-11-19 | Discharge: 2019-11-19 | Disposition: A | Discharge status: Home / Self Care | Payer: 59 | Source: Home / Self Care

## 2019-11-19 ENCOUNTER — Encounter (HOSPITAL_COMMUNITY): Payer: Self-pay | Admitting: *Deleted

## 2019-11-19 DIAGNOSIS — Z793 Long term (current) use of hormonal contraceptives: Secondary | ICD-10-CM | POA: Insufficient documentation

## 2019-11-19 DIAGNOSIS — Z79899 Other long term (current) drug therapy: Secondary | ICD-10-CM | POA: Insufficient documentation

## 2019-11-19 DIAGNOSIS — R002 Palpitations: Secondary | ICD-10-CM | POA: Diagnosis not present

## 2019-11-19 LAB — CBC WITH DIFFERENTIAL/PLATELET
Abs Immature Granulocytes: 0.01 10*3/uL (ref 0.00–0.07)
Basophils Absolute: 0 10*3/uL (ref 0.0–0.1)
Basophils Relative: 1 %
Eosinophils Absolute: 0 10*3/uL (ref 0.0–0.5)
Eosinophils Relative: 1 %
HCT: 46.7 % — ABNORMAL HIGH (ref 36.0–46.0)
Hemoglobin: 15.1 g/dL — ABNORMAL HIGH (ref 12.0–15.0)
Immature Granulocytes: 0 %
Lymphocytes Relative: 40 %
Lymphs Abs: 2.1 10*3/uL (ref 0.7–4.0)
MCH: 30.3 pg (ref 26.0–34.0)
MCHC: 32.3 g/dL (ref 30.0–36.0)
MCV: 93.8 fL (ref 80.0–100.0)
Monocytes Absolute: 0.5 10*3/uL (ref 0.1–1.0)
Monocytes Relative: 10 %
Neutro Abs: 2.5 10*3/uL (ref 1.7–7.7)
Neutrophils Relative %: 48 %
Platelets: 305 10*3/uL (ref 150–400)
RBC: 4.98 MIL/uL (ref 3.87–5.11)
RDW: 12.1 % (ref 11.5–15.5)
WBC: 5.2 10*3/uL (ref 4.0–10.5)
nRBC: 0 % (ref 0.0–0.2)

## 2019-11-19 LAB — BASIC METABOLIC PANEL
Anion gap: 8 (ref 5–15)
BUN: 8 mg/dL (ref 6–20)
CO2: 27 mmol/L (ref 22–32)
Calcium: 9.2 mg/dL (ref 8.9–10.3)
Chloride: 104 mmol/L (ref 98–111)
Creatinine, Ser: 0.68 mg/dL (ref 0.44–1.00)
GFR calc Af Amer: >60 mL/min (ref >60)
GFR calc non Af Amer: >60 mL/min (ref >60)
Glucose, Bld: 107 mg/dL — ABNORMAL HIGH (ref 70–99)
Potassium: 4.1 mmol/L (ref 3.5–5.1)
Sodium: 139 mmol/L (ref 135–145)

## 2019-11-19 LAB — TSH: TSH: 1.003 u[IU]/mL (ref 0.350–4.500)

## 2019-11-19 LAB — MAGNESIUM: Magnesium: 2.4 mg/dL (ref 1.7–2.4)

## 2019-11-19 LAB — TROPONIN I (HIGH SENSITIVITY): Troponin I (High Sensitivity): <2 ng/L (ref <18)

## 2019-11-19 NOTE — Discharge Instructions (Signed)
You were seen in the ER for flutters in your chest  There were occasional PVCs on your EKG otherwise work-up today was normal.  PVCs are usually benign and rarely cause issues but can cause sensation of palpitations.  Usually there is no specific treatment for PVCs unless palpitations are severe.  Avoid caffeine.  Stay well-hydrated and sleep well.  Other common things that can cause palpitations include stress, alcohol and certain medicines.  Follow-up with cardiology in the next 7 to 10 days for reevaluation if your palpitations continue, they may consider doing more testing to capture the palpitations.  They can also start you on medicines to help with your symptoms as needed  Return to the ER for worsening symptoms, passing out, chest pain or shortness of breath with exertion, leg swelling or calf pain

## 2019-11-19 NOTE — ED Triage Notes (Signed)
Pt presents with c/o heart palpitations happing 5-6 a day with episodes of dizziness, recommended pt be evaluated in ED

## 2019-11-19 NOTE — ED Triage Notes (Signed)
C/o irregular heart rate intermittent for over a week

## 2019-11-19 NOTE — ED Provider Notes (Signed)
[facility] EMERGENCY DEPARTMENT Provider Note   CSN: [id number] Arrival date & time: 11/19/19  1403     History Chief Complaint  Patient presents with  . Palpitations    Sherry Chavez is a 38 y.o. female presents to the ER for evaluation of palpitations.  Onset 1 week ago.  Initially it was occurring once a day however over the last 24 hours the symptoms have become more frequent.  Last night she felt like she could not sleep due to the frequency of palpitations.  Today they seem to be happening every hour.  Described as "flutters" that last 2 to 3 seconds associated with lightheadedness and feeling faint, some pressure discomfort in her chest and also resolves when the flutters stop.  She sometimes feels like she has to cough during them.  Has mostly noticed them at rest and not on exertion.  Denies previous history of palpitations, abnormal heart rhythms, heart disease.  Her father has atrial fibrillation.  She has history of goiter but states her thyroid levels have always been normal.  No changes in medications lately.  She is on birth control Junel and is currently on her 1 week off and expecting her menses soon.  Denies illicit drug use.  Denies EtOH use.  No difficulty with sleep or lack of sleep.  Has always tried to be well-hydrated.  Denies changes in caffeine intake.  No associated recent illnesses.  No exertional chest pain, shortness of breath or syncope.  HPI     Past Medical History:  Diagnosis Date  . Blood in stool   . Chicken pox   . Enlarged thyroid   . Frequent headaches   . GERD (gastroesophageal reflux disease) 2003  . IBS (irritable bowel syndrome)   . UTI (urinary tract infection)     Patient Active Problem List   Diagnosis Date Noted  . Diarrhea 07/02/2019  . Chronic left-sided low back pain with left-sided sciatica 12/13/2018  . Routine general medical examination at a health care facility 05/18/2018  . Multinodular goiter (nontoxic) 05/02/2017    . Irritable bowel syndrome 01/03/2014    Past Surgical History:  Procedure Laterality Date  . DILATION AND CURETTAGE OF UTERUS  06/2007  . [place]; Age 12     OB History    Gravida  5   Para  3   Term  3   Preterm  0   AB  2   Living  3     SAB  2   TAB  0   Ectopic  0   Multiple  0   Live Births  3           Family History  Problem Relation Age of Onset  . Irritable bowel syndrome Father   . Hypertension Mother   . Spina bifida Sister   . Ulcerative colitis Paternal Grandmother   . Alcohol abuse Paternal Grandmother   . Diabetes Maternal Grandmother   . Stroke Maternal Grandmother   . Cancer Maternal Grandfather   . Hearing loss Paternal Grandfather     Social History   Tobacco Use  . Smoking status: Never Smoker  . Smokeless tobacco: Never Used  Substance Use Topics  . Alcohol use: Yes    Alcohol/week: 3.0 standard drinks    Types: 3 Standard drinks or equivalent per week  . Drug use: No    Home Medications Prior to Admission medications   Medication Sig Start Date End Date  Taking? Authorizing Provider  Cholecalciferol (VITAMIN D PO) Take 2,000 Int'l Units by mouth daily.   Yes [provider]  Magnesium Citrate 200 MG TABS Take 200 mg by mouth daily.   Yes [provider]  Multiple Vitamins-Minerals (MULTIVITAMIN PO) Take 1 tablet by mouth daily.    Yes [provider]  Norethindrone Acetate-Ethinyl Estradiol (JUNEL 1.5/30) 1.5-30 MG-MCG tablet Take 1 tablet by mouth daily. Take continuously, skip the placebo week. 02/06/19  Yes Salvadore Dom, MD  Turmeric 400 MG CAPS  07/05/18  Yes [provider]  vitamin C (ASCORBIC ACID) 500 MG tablet Take 1 tablet once a day   Yes [provider]    Allergies    Codeine  Review of Systems   Review of Systems  Cardiovascular: Positive for chest pain (pressure) and palpitations.  Neurological: Positive for  light-headedness.  All other systems reviewed and are negative.   Physical Exam Updated Vital Signs BP 121/83   Pulse 84   Temp 98.5 F (36.9 C)   Resp 13   Ht 5' 5.5" (1.664 m)   Wt 63.5 kg   LMP 11/11/2019   SpO2 100%   BMI 22.94 kg/m   Physical Exam Vitals and nursing note reviewed.  Constitutional:      General: She is not in acute distress.    Appearance: She is well-developed.     Comments: NAD.  HENT:     Head: Normocephalic and atraumatic.     Right Ear: External ear normal.     Left Ear: External ear normal.     Nose: Nose normal.  Eyes:     General: No scleral icterus.    Conjunctiva/sclera: Conjunctivae normal.  Cardiovascular:     Rate and Rhythm: Normal rate and regular rhythm.     Heart sounds: Normal heart sounds.     Comments: Paired PVCs noted during evaluation once.  NSR otherwise.  Heart rate less than 100.  1+ radial and DP pulses bilaterally.  No lower extremity edema or calf tenderness. Pulmonary:     Effort: Pulmonary effort is normal.     Breath sounds: Normal breath sounds.  Musculoskeletal:        General: No deformity. Normal range of motion.     Cervical back: Normal range of motion and neck supple.  Skin:    General: Skin is warm and dry.     Capillary Refill: Capillary refill takes less than 2 seconds.  Neurological:     Mental Status: She is alert and oriented to person, place, and time.  Psychiatric:        Behavior: Behavior normal.        Thought Content: Thought content normal.        Judgment: Judgment normal.     ED Results / Procedures / Treatments   Labs (all labs ordered are listed, but only abnormal results are displayed) Labs Reviewed  CBC WITH DIFFERENTIAL/PLATELET - Abnormal; Notable for the following components:      Result Value   Hemoglobin 15.1 (*)    HCT 46.7 (*)    All other components within normal limits  BASIC METABOLIC PANEL - Abnormal; Notable for the following components:   Glucose, Bld 107 (*)     All other components within normal limits  TSH  MAGNESIUM  TROPONIN I (HIGH SENSITIVITY)    EKG None  Radiology No results found.  Procedures Procedures (including critical care time)  Medications Ordered in ED Medications - No  data to display  ED Course  I have reviewed the triage vital signs and the nursing notes.  Pertinent labs & imaging results that were available during my care of the patient were reviewed by me and considered in my medical decision making (see chart for details).    MDM Rules/Calculators/A&P                      38 year old female presents for palpitations.  Associated with lightheadedness.  Exam is benign and noncontributory.  No tachycardia or bradycardia.  Hemodynamically stable.  ER work-up personally reviewed, vastly reassuring.  Noted coupled PVCs during examination but patient was asymptomatic at that time.  Patient stated she has had intermittent palpitations while here in the ER but no significant dysrhythmias while on monitor here.   She has no other concerning symptoms like shortness of breath, exertional chest pain, pleuritic chest pain, known cardiac arrhythmias, syncope.  Denies illicit drug use, ETOH use, medication changes, significant stress at home, increase in caffeine intake.  Highest on DDX is occasional symptomatic PVCs.  I considered life-threatening cardiac dysrhythmia unlikely.  She has no chest pain, shortness of breath and considered PE very unlikely in this clinical picture.  Well score is 0.    Considered metoprolol at discharge however will defer this and have patient follow-up with cardiology as her symptoms are mild.  Discussed return precautions.  Patient is comfortable this. Final Clinical Impression(s) / ED Diagnoses Final diagnoses:  Palpitations    Rx / DC Orders ED Discharge Orders    None       Kinnie Feil, PA-C 11/19/19 2212    Veryl Speak, MD 11/19/19 2314

## 2020-02-10 ENCOUNTER — Ambulatory Visit: Payer: 59 | Admitting: Obstetrics and Gynecology

## 2020-02-17 NOTE — Progress Notes (Signed)
39 y.o. [id number] Married White or Caucasian Not Hispanic or Latino female here for annual exam.  She went off of OCP's in mid December, just wasn't feeling well. She feels better off of OCP's. She was on OCP's for ovulation pain. So far it hasn't come back. She feels emotionally better off of OCP's.   She had a GI infection in July, has had issues with postinfectious IBS. Just recently started feeling normal.  She had some palpitations, was having some PVC's.  Period Cycle (Days): 28 Period Duration (Days): 4 Period Pattern: Regular Menstrual Flow: Light Menstrual Control: Tampon, Panty liner Menstrual Control Change Freq (Hours): changes tampon/liner every 3-4 hours Dysmenorrhea: None   She has gotten her first Covid vaccine.   Patient's last menstrual period was 02/06/2020.          Sexually active: Yes.    The current method of family planning is vasectomy.    Exercising: Yes.    running 4-5 times a week Smoker:  no  Health Maintenance: Pap:01/20/17 Neg HR HPV Neg  01-16-15 neg History of abnormal Pap:  no MMG: 06/10/14 Diagnostic density B Bi-rads 1 neg  BMD:   never Colonoscopy: 12/03/12 normal repeat at 51  TDaP:  2018 Gardasil: no   reports that she has never smoked. She has never used smokeless tobacco. She reports current alcohol use of about 3.0 standard drinks of alcohol per week. She reports that she does not use drugs. 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 Blood in stool   . Chicken pox   . Enlarged thyroid   . Frequent headaches   . GERD (gastroesophageal reflux disease) 2003  . IBS (irritable bowel syndrome)   . UTI (urinary tract infection)     Past Surgical History:  Procedure Laterality Date  . DILATION AND CURETTAGE OF UTERUS  06/2007  . [place]; Age 20    Current Outpatient Medications  Medication Sig Dispense Refill  . Cholecalciferol (VITAMIN D PO) Take 2,000  Int'l Units by mouth daily.    . Magnesium Citrate 200 MG TABS Take 200 mg by mouth daily.    . Multiple Vitamins-Minerals (MULTIVITAMIN PO) Take 1 tablet by mouth daily.     . Turmeric 400 MG CAPS     . vitamin C (ASCORBIC ACID) 500 MG tablet Take 1 tablet once a day     No current facility-administered medications for this visit.    Family History  Problem Relation Age of Onset  . Irritable bowel syndrome Father   . Hypertension Mother   . Spina bifida Sister   . Ulcerative colitis Paternal Grandmother   . Alcohol abuse Paternal Grandmother   . Diabetes Maternal Grandmother   . Stroke Maternal Grandmother   . Cancer Maternal Grandfather   . Hearing loss Paternal Grandfather     Review of Systems  Constitutional: Negative.   HENT: Negative.   Eyes: Negative.   Respiratory: Negative.   Cardiovascular: Negative.   Gastrointestinal: Negative.   Endocrine: Negative.   Genitourinary: Negative.   Musculoskeletal: Negative.   Skin: Negative.   Allergic/Immunologic: Negative.   Neurological: Negative.   Hematological: Negative.   Psychiatric/Behavioral: Negative.     Exam:   BP 116/68 (BP Location: Right Arm, Patient Position: Sitting, Cuff Size: Normal)   Pulse 86   Temp 98.1 F (36.7 C) (Skin)   Resp 14   Ht 5\' 5"  (1.651  m)   Wt 137 lb 6.4 oz (62.3 kg)   LMP 02/06/2020   BMI 22.86 kg/m   Weight change: @WEIGHTCHANGE @ Height:   Height: 5\' 5"  (165.1 cm)  Ht Readings from Last 3 Encounters:  02/18/20 5\' 5"  (1.651 m)  11/19/19 5' 5.5" (1.664 m)  02/06/19 5\' 5"  (1.651 m)    General appearance: alert, cooperative and appears stated age Head: Normocephalic, without obvious abnormality, atraumatic Neck: no adenopathy, supple, symmetrical, trachea midline and thyroid normal to inspection and palpation Lungs: clear to auscultation bilaterally Cardiovascular: regular rate and rhythm Breasts: normal appearance, no masses or tenderness Abdomen: soft, non-tender; non  distended,  no masses,  no organomegaly Extremities: extremities normal, atraumatic, no cyanosis or edema Skin: Skin color, texture, turgor normal. No rashes or lesions Lymph nodes: Cervical, supraclavicular, and axillary nodes normal. No abnormal inguinal nodes palpated Neurologic: Grossly normal   Pelvic: External genitalia:  no lesions              Urethra:  normal appearing urethra with no masses, tenderness or lesions              Bartholins and Skenes: normal                 Vagina: normal appearing vagina with normal color and discharge, no lesions              Cervix: no lesions               Bimanual Exam:  Uterus:  normal size, contour, position, consistency, mobility, non-tender              Adnexa: no mass, fullness, tenderness               Rectovaginal: Confirms               Anus:  normal sphincter tone, no lesions  Karmen Bongo chaperoned for the exam.  A:  Well Woman with normal exam  P:   No pap this year  Discussed breast self exam  Discussed calcium and vit D intake  Labs are UTD

## 2020-02-18 ENCOUNTER — Ambulatory Visit: Payer: 59 | Admitting: Obstetrics and Gynecology

## 2020-02-18 ENCOUNTER — Other Ambulatory Visit: Payer: Self-pay

## 2020-02-18 ENCOUNTER — Encounter: Payer: Self-pay | Admitting: Obstetrics and Gynecology

## 2020-02-18 VITALS — BP 116/68 | HR 86 | Temp 98.1°F | Resp 14 | Ht 65.0 in | Wt 137.4 lb

## 2020-02-18 DIAGNOSIS — Z01419 Encounter for gynecological examination (general) (routine) without abnormal findings: Secondary | ICD-10-CM | POA: Diagnosis not present

## 2020-02-18 NOTE — Patient Instructions (Signed)
EXERCISE AND DIET:  We recommended that you start or continue a regular exercise program for good health. Regular exercise means any activity that makes your heart beat faster and makes you sweat.  We recommend exercising at least 30 minutes per day at least 3 days a week, preferably 4 or 5.  We also recommend a diet low in fat and sugar.  Inactivity, poor dietary choices and obesity can cause diabetes, heart attack, stroke, and kidney damage, among others.    ALCOHOL AND SMOKING:  Women should limit their alcohol intake to no more than 7 drinks/beers/glasses of wine (combined, not each!) per week. Moderation of alcohol intake to this level decreases your risk of breast cancer and liver damage. And of course, no recreational drugs are part of a healthy lifestyle.  And absolutely no smoking or even second hand smoke. Most people know smoking can cause heart and lung diseases, but did you know it also contributes to weakening of your bones? Aging of your skin?  Yellowing of your teeth and nails?  CALCIUM AND VITAMIN D:  Adequate intake of calcium and Vitamin D are recommended.  The recommendations for exact amounts of these supplements seem to change often, but generally speaking 1,000 mg of calcium (between diet and supplement) and 800 units of Vitamin D per day seems prudent. Certain women may benefit from higher intake of Vitamin D.  If you are among these women, your doctor will have told you during your visit.    PAP SMEARS:  Pap smears, to check for cervical cancer or precancers,  have traditionally been done yearly, although recent scientific advances have shown that most women can have pap smears less often.  However, every woman still should have a physical exam from her gynecologist every year. It will include a breast check, inspection of the vulva and vagina to check for abnormal growths or skin changes, a visual exam of the cervix, and then an exam to evaluate the size and shape of the uterus and  ovaries.  And after 38 years of age, a rectal exam is indicated to check for rectal cancers. We will also provide age appropriate advice regarding health maintenance, like when you should have certain vaccines, screening for sexually transmitted diseases, bone density testing, colonoscopy, mammograms, etc.   MAMMOGRAMS:  All women over 40 years old should have a yearly mammogram. Many facilities now offer a "3D" mammogram, which may cost around $50 extra out of pocket. If possible,  we recommend you accept the option to have the 3D mammogram performed.  It both reduces the number of women who will be called back for extra views which then turn out to be normal, and it is better than the routine mammogram at detecting truly abnormal areas.    COLON CANCER SCREENING: Now recommend starting at age 45. At this time colonoscopy is not covered for routine screening until 50. There are take home tests that can be done between 45-49.   COLONOSCOPY:  Colonoscopy to screen for colon cancer is recommended for all women at age 50.  We know, you hate the idea of the prep.  We agree, BUT, having colon cancer and not knowing it is worse!!  Colon cancer so often starts as a polyp that can be seen and removed at colonscopy, which can quite literally save your life!  And if your first colonoscopy is normal and you have no family history of colon cancer, most women don't have to have it again for   10 years.  Once every ten years, you can do something that may end up saving your life, right?  We will be happy to help you get it scheduled when you are ready.  Be sure to check your insurance coverage so you understand how much it will cost.  It may be covered as a preventative service at no cost, but you should check your particular policy.      Breast Self-Awareness Breast self-awareness means being familiar with how your breasts look and feel. It involves checking your breasts regularly and reporting any changes to your  health care provider. Practicing breast self-awareness is important. A change in your breasts can be a sign of a serious medical problem. Being familiar with how your breasts look and feel allows you to find any problems early, when treatment is more likely to be successful. All women should practice breast self-awareness, including women who have had breast implants. How to do a breast self-exam One way to learn what is normal for your breasts and whether your breasts are changing is to do a breast self-exam. To do a breast self-exam: Look for Changes  1. Remove all the clothing above your waist. 2. Stand in front of a mirror in a room with good lighting. 3. Put your hands on your hips. 4. Push your hands firmly downward. 5. Compare your breasts in the mirror. Look for differences between them (asymmetry), such as: ? Differences in shape. ? Differences in size. ? Puckers, dips, and bumps in one breast and not the other. 6. Look at each breast for changes in your skin, such as: ? Redness. ? Scaly areas. 7. Look for changes in your nipples, such as: ? Discharge. ? Bleeding. ? Dimpling. ? Redness. ? A change in position. Feel for Changes Carefully feel your breasts for lumps and changes. It is best to do this while lying on your back on the floor and again while sitting or standing in the shower or tub with soapy water on your skin. Feel each breast in the following way:  Place the arm on the side of the breast you are examining above your head.  Feel your breast with the other hand.  Start in the nipple area and make  inch (2 cm) overlapping circles to feel your breast. Use the pads of your three middle fingers to do this. Apply light pressure, then medium pressure, then firm pressure. The light pressure will allow you to feel the tissue closest to the skin. The medium pressure will allow you to feel the tissue that is a little deeper. The firm pressure will allow you to feel the tissue  close to the ribs.  Continue the overlapping circles, moving downward over the breast until you feel your ribs below your breast.  Move one finger-width toward the center of the body. Continue to use the  inch (2 cm) overlapping circles to feel your breast as you move slowly up toward your collarbone.  Continue the up and down exam using all three pressures until you reach your armpit.  Write Down What You Find  Write down what is normal for each breast and any changes that you find. Keep a written record with breast changes or normal findings for each breast. By writing this information down, you do not need to depend only on memory for size, tenderness, or location. Write down where you are in your menstrual cycle, if you are still menstruating. If you are having trouble noticing differences   in your breasts, do not get discouraged. With time you will become more familiar with the variations in your breasts and more comfortable with the exam. How often should I examine my breasts? Examine your breasts every month. If you are breastfeeding, the best time to examine your breasts is after a feeding or after using a breast pump. If you menstruate, the best time to examine your breasts is 5-7 days after your period is over. During your period, your breasts are lumpier, and it may be more difficult to notice changes. When should I see my health care provider? See your health care provider if you notice:  A change in shape or size of your breasts or nipples.  A change in the skin of your breast or nipples, such as a reddened or scaly area.  Unusual discharge from your nipples.  A lump or thick area that was not there before.  Pain in your breasts.  Anything that concerns you.  

## 2020-02-21 ENCOUNTER — Encounter: Payer: Self-pay | Admitting: Certified Nurse Midwife

## 2020-03-19 IMAGING — CT CT ABDOMEN AND PELVIS WITH CONTRAST
1 of 2 series · 14 of 32 positions shown, 19 images · IV contrast (APPLIED)
Comparison: Abdominal ultrasound dated 01/26/2015 and lumbar spine
radiograph dated 12/12/2018

CLINICAL DATA: 37-year-old female with persistent diarrhea and
nausea and mid abdominal pain.

EXAM:
CT ABDOMEN AND PELVIS WITH CONTRAST
TECHNIQUE: Multidetector CT imaging of the abdomen and pelvis was performed
using the standard protocol following bolus administration of
intravenous contrast.
CONTRAST:  100mL [id number] IOPAMIDOL (YCLRJ1-ZJJ) INJECTION 61%

[Series 2: abd/pelvis w/cm · axial · 0.74mm/px · z∈[-409,-24]mm · 14 of 87 slices shown, 19 images]
[im 5/87  soft-tissue]
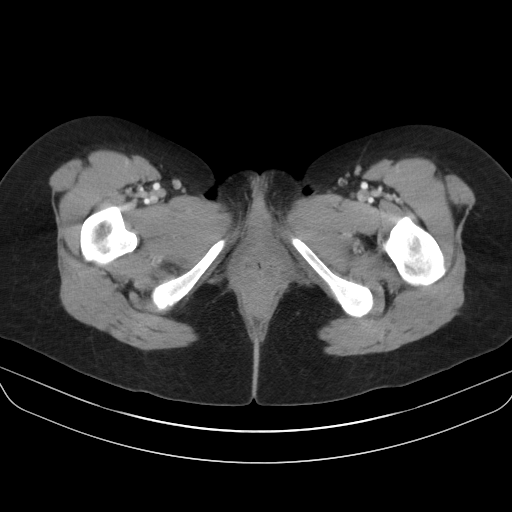
[im 5/87  bone]
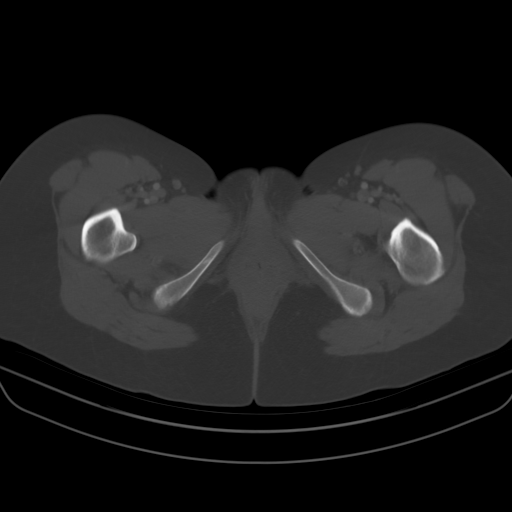
[im 14/87  soft-tissue]
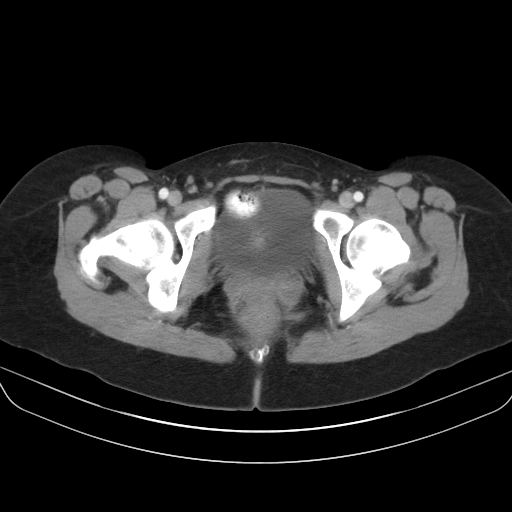
[im 19/87  soft-tissue]
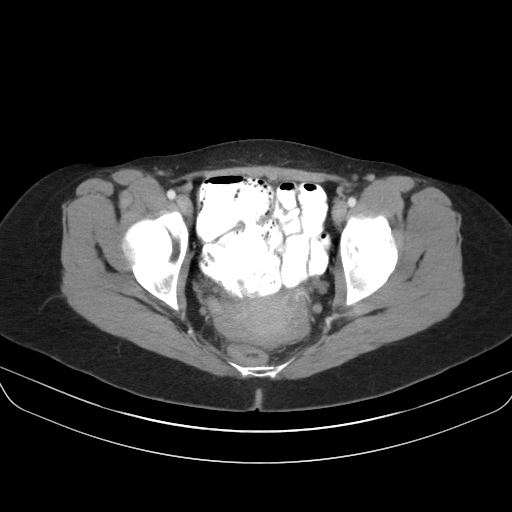
[im 23/87  soft-tissue]
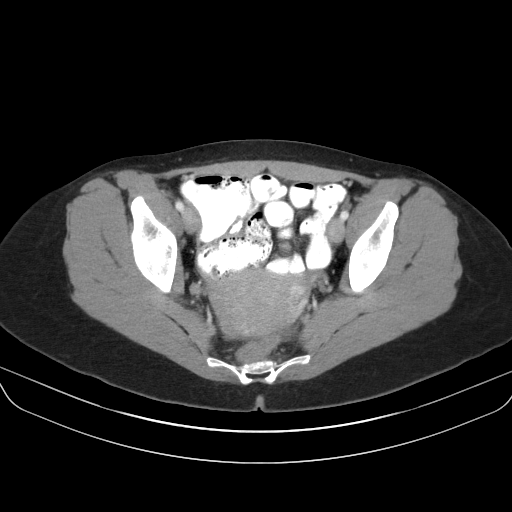
[im 32/87  soft-tissue]
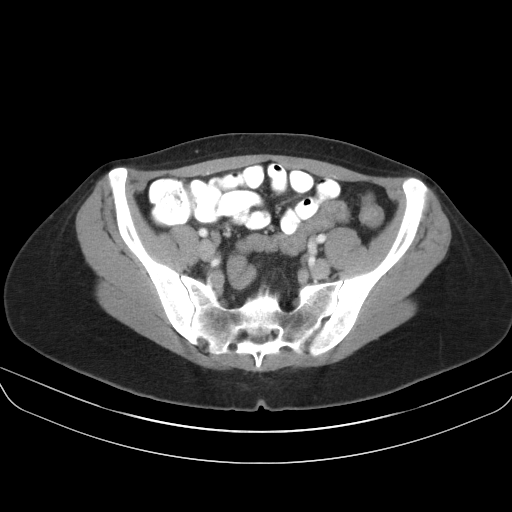
[im 37/87  soft-tissue]
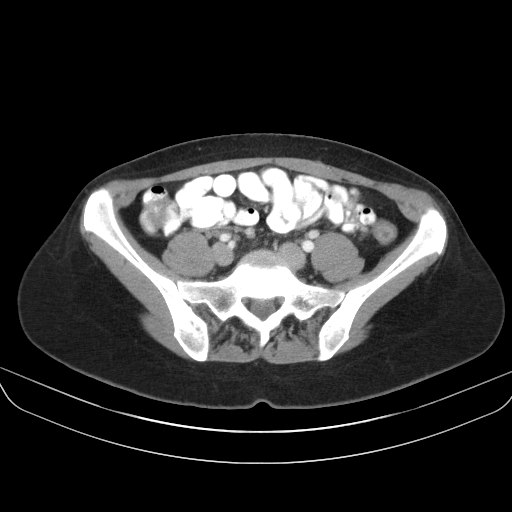
[im 46/87  soft-tissue]
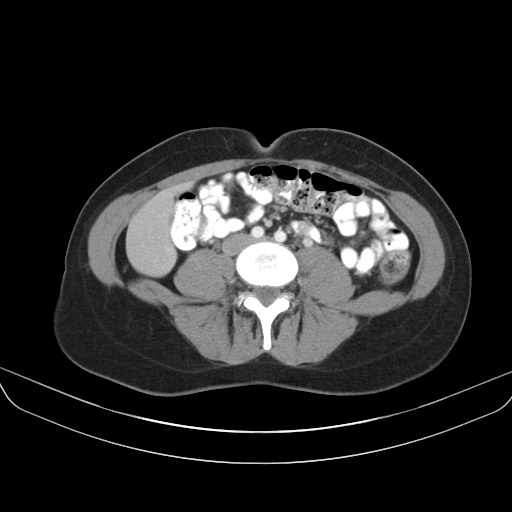
[im 50/87  soft-tissue]
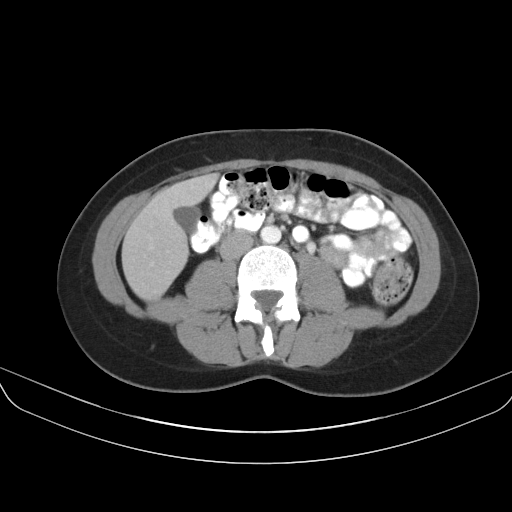
[im 55/87  soft-tissue]
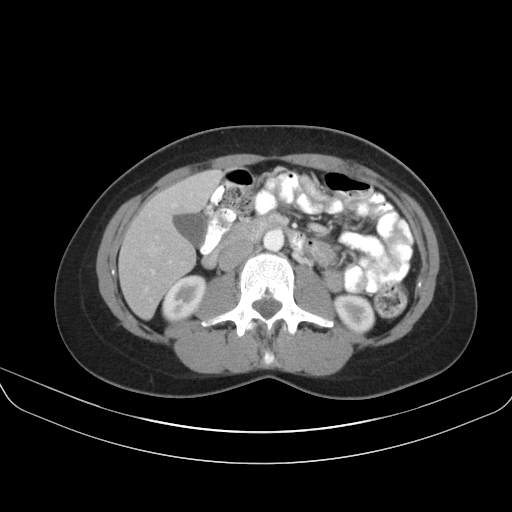
[im 55/87  bone]
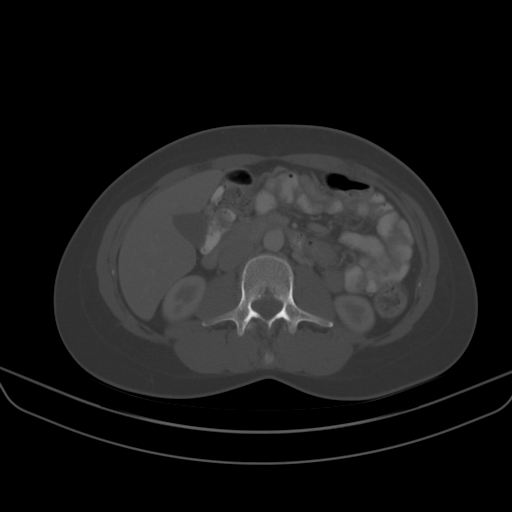
[im 64/87  soft-tissue]
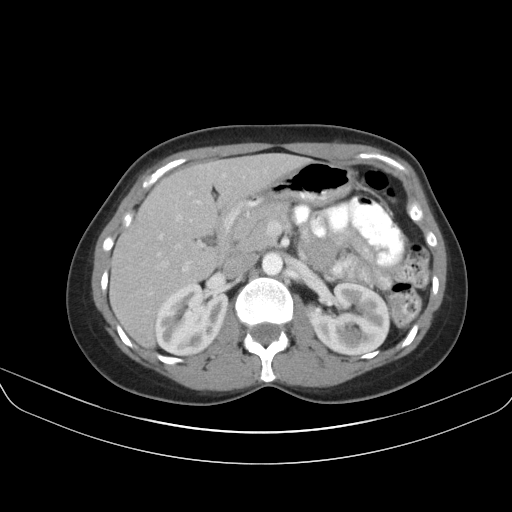
[im 68/87  soft-tissue]
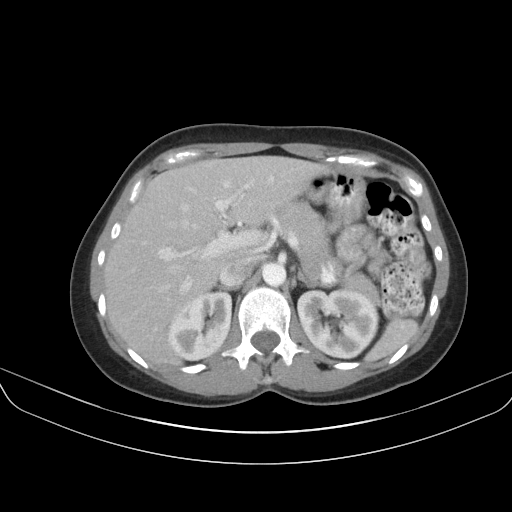
[im 68/87  lung]
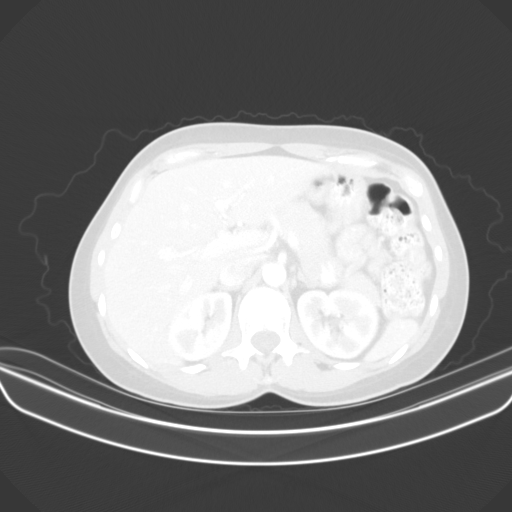
[im 73/87  soft-tissue]
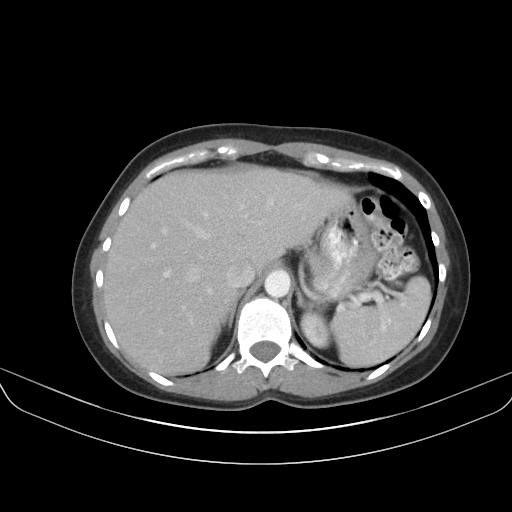
[im 73/87  lung]
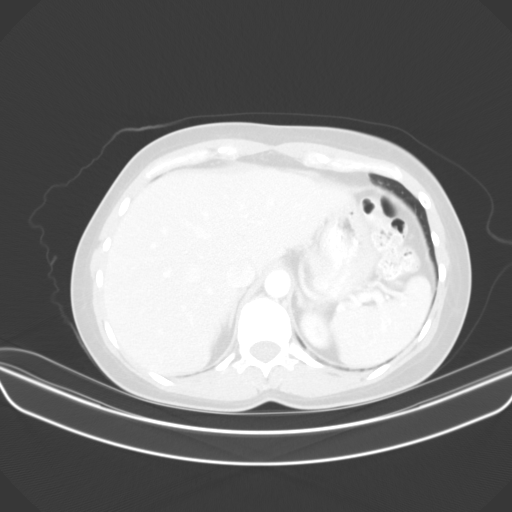
[im 77/87  lung]
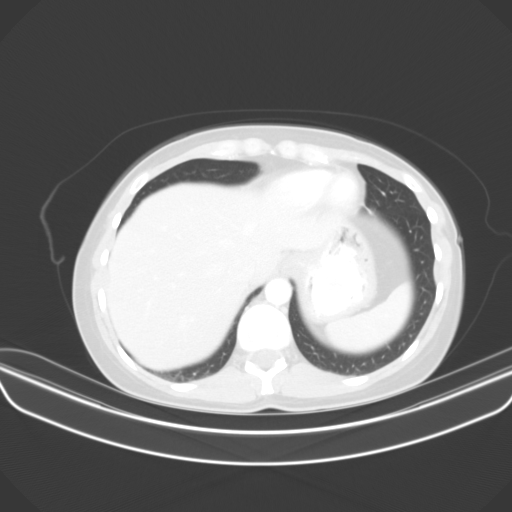
[im 82/87  soft-tissue]
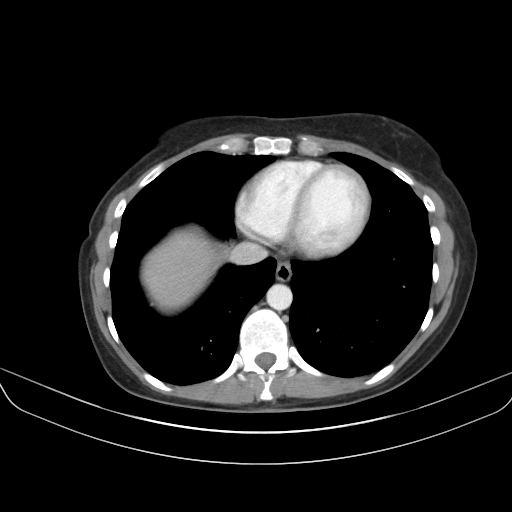
[im 82/87  lung]
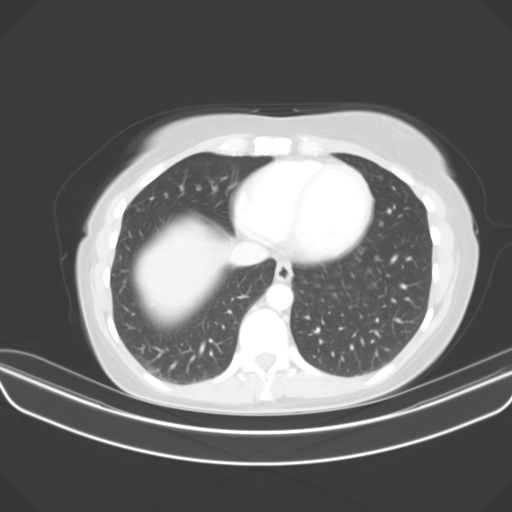

[14 of 32 positions shown; findings below may reference images not displayed]

FINDINGS: Lower chest: The visualized lung bases are clear.

No intra-abdominal free air. There is trace free fluid in the
posterior pelvis.

Hepatobiliary: Subcentimeter hypodense focus in the inferior right
lobe of the liver is too small to characterize. The liver is
otherwise unremarkable. No intrahepatic biliary ductal dilatation.
The gallbladder is unremarkable.

Pancreas: Unremarkable. No pancreatic ductal dilatation or
surrounding inflammatory changes.

Spleen: Normal in size without focal abnormality.

Adrenals/Urinary Tract: Adrenal glands are unremarkable. Kidneys are
normal, without renal calculi, focal lesion, or hydronephrosis.
Bladder is unremarkable.

Stomach/Bowel: Thickened appearance of the distal colon, likely
related to underdistention. Mild colitis is less likely. Clinical
correlation is recommended. There is no bowel obstruction. The
appendix is not visualized with certainty. No inflammatory changes
identified in the right lower quadrant.

Vascular/Lymphatic: No significant vascular findings are present. No
enlarged abdominal or pelvic lymph nodes.

Reproductive: The uterus and ovaries are grossly unremarkable. No
pelvic mass.

Other: Small fat containing umbilical hernia.

Musculoskeletal: No acute or significant osseous findings.
IMPRESSION: 1. Thickened appearance of the distal colon, likely related to
underdistention. Mild colitis is less likely. Clinical correlation
is recommended. No bowel obstruction.
2. No other acute intra-abdominal or pelvic pathology.

## 2020-10-15 ENCOUNTER — Encounter: Payer: Self-pay | Admitting: Internal Medicine

## 2020-10-15 NOTE — Telephone Encounter (Signed)
Please advise 

## 2020-12-31 ENCOUNTER — Other Ambulatory Visit: Payer: No Typology Code available for payment source

## 2020-12-31 DIAGNOSIS — Z20822 Contact with and (suspected) exposure to covid-19: Secondary | ICD-10-CM

## 2021-01-01 LAB — SARS-COV-2, NAA 2 DAY TAT

## 2021-01-01 LAB — NOVEL CORONAVIRUS, NAA: SARS-CoV-2, NAA: NOT DETECTED

## 2021-02-25 ENCOUNTER — Ambulatory Visit: Payer: 59 | Admitting: Obstetrics and Gynecology

## 2021-03-29 NOTE — Progress Notes (Signed)
40 y.o. [id number] Married White or Caucasian Not Hispanic or Latino female here for annual exam.   Period Cycle (Days): 28 Period Duration (Days): 4 Period Pattern: Regular Menstrual Flow: Moderate Menstrual Control: Tampon Menstrual Control Change Freq (Hours): 4-5 Dysmenorrhea: (!) Mild Dysmenorrhea Symptoms: Headache,Diarrhea,Nausea,Cramping   Followed by Endocrinology for multinodular goiter  Patient's last menstrual period was 03/10/2021.          Sexually active: Yes.    The current method of family planning is vasectomy.    Exercising: Yes.    Cardio and weights  Smoker:  no  Health Maintenance: Pap:  01/20/17 Neg HR HPV Neg  01-16-15 neg History of abnormal Pap:  no MMG:  diag 06/10/14 bi-rads 1 neg BMD:   na Colonoscopy: 12/03/12 normal repeat at age 79 TDaP:  2018  Gardasil: no    reports that she has never smoked. She has never used smokeless tobacco. She reports current alcohol use of about 3.0 standard drinks of alcohol per week. She reports that she does not use drugs. 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 Blood in stool   . Chicken pox   . Enlarged thyroid   . Frequent headaches   . GERD (gastroesophageal reflux disease) 2003  . IBS (irritable bowel syndrome)   . Multinodular goiter   . UTI (urinary tract infection)     Past Surgical History:  Procedure Laterality Date  . DILATION AND CURETTAGE OF UTERUS  06/2007  . [place]; Age 89    Current Outpatient Medications  Medication Sig Dispense Refill  . Cholecalciferol (VITAMIN D PO) Take 2,000 Int'l Units by mouth daily.    . Magnesium Citrate 200 MG TABS Take 200 mg by mouth daily.    . Multiple Vitamins-Minerals (MULTIVITAMIN PO) Take 1 tablet by mouth daily.     . Turmeric 400 MG CAPS     . vitamin C (ASCORBIC ACID) 500 MG tablet Take 1 tablet once a day     No current facility-administered medications for this  visit.    Family History  Problem Relation Age of Onset  . Irritable bowel syndrome Father   . Hypertension Mother   . Spina bifida Sister   . Ulcerative colitis Paternal Grandmother   . Alcohol abuse Paternal Grandmother   . Diabetes Maternal Grandmother   . Stroke Maternal Grandmother   . Cancer Maternal Grandfather   . Hearing loss Paternal Grandfather     Review of Systems  All other systems reviewed and are negative.   Exam:   BP 122/76   Pulse 81   Ht 5\' 5"  (1.651 m)   Wt 141 lb (64 kg)   LMP 03/10/2021   SpO2 100%   BMI 23.46 kg/m   Weight change: @WEIGHTCHANGE @ Height:   Height: 5\' 5"  (165.1 cm)  Ht Readings from Last 3 Encounters:  03/30/21 5\' 5"  (1.651 m)  02/18/20 5\' 5"  (1.651 m)  11/19/19 5' 5.5" (1.664 m)    General appearance: alert, cooperative and appears stated age Head: Normocephalic, without obvious abnormality, atraumatic Neck: no adenopathy, supple, symmetrical, trachea midline and thyroid normal to inspection and palpation Lungs: clear to auscultation bilaterally Cardiovascular: regular rate and rhythm Breasts: normal appearance, no masses or tenderness Abdomen: soft, non-tender; non distended,  no masses,  no organomegaly Extremities: extremities normal, atraumatic, no cyanosis or edema Skin: Skin color, texture, turgor normal. No rashes or lesions Lymph  nodes: Cervical, supraclavicular, and axillary nodes normal. No abnormal inguinal nodes palpated Neurologic: Grossly normal   Pelvic: External genitalia:  no lesions              Urethra:  normal appearing urethra with no masses, tenderness or lesions              Bartholins and Skenes: normal                 Vagina: normal appearing vagina with normal color and discharge, no lesions              Cervix: no lesions               Bimanual Exam:  Uterus:  normal size, contour, position, consistency, mobility, non-tender              Adnexa: no mass, fullness, tenderness                Rectovaginal: Confirms               Anus:  normal sphincter tone, no lesions  Gae Dry chaperoned for the exam.  1. Well woman exam Discussed breast self exam Discussed calcium and vit D intake  Pap next year Mammogram at 41  2. Laboratory exam ordered as part of routine general medical examination - CBC - Comprehensive metabolic panel - Lipid panel

## 2021-03-30 ENCOUNTER — Ambulatory Visit (INDEPENDENT_AMBULATORY_CARE_PROVIDER_SITE_OTHER): Payer: No Typology Code available for payment source | Admitting: Obstetrics and Gynecology

## 2021-03-30 ENCOUNTER — Encounter: Payer: Self-pay | Admitting: Obstetrics and Gynecology

## 2021-03-30 ENCOUNTER — Other Ambulatory Visit: Payer: Self-pay

## 2021-03-30 VITALS — BP 122/76 | HR 81 | Ht 65.0 in | Wt 141.0 lb

## 2021-03-30 DIAGNOSIS — Z0000 Encounter for general adult medical examination without abnormal findings: Secondary | ICD-10-CM | POA: Diagnosis not present

## 2021-03-30 DIAGNOSIS — E042 Nontoxic multinodular goiter: Secondary | ICD-10-CM

## 2021-03-30 DIAGNOSIS — Z01419 Encounter for gynecological examination (general) (routine) without abnormal findings: Secondary | ICD-10-CM

## 2021-03-30 LAB — LIPID PANEL
Cholesterol: 150 mg/dL (ref <200)
HDL: 64 mg/dL (ref >=50)
LDL Cholesterol (Calc): 72 mg/dL (calc)
Non-HDL Cholesterol (Calc): 86 mg/dL (calc) (ref <130)
Total CHOL/HDL Ratio: 2.3 (calc) (ref <5.0)
Triglycerides: 59 mg/dL (ref <150)

## 2021-03-30 LAB — COMPREHENSIVE METABOLIC PANEL
AG Ratio: 2.3 (calc) (ref 1.0–2.5)
ALT: 12 U/L (ref 6–29)
AST: 11 U/L (ref 10–30)
Albumin: 4.9 g/dL (ref 3.6–5.1)
Alkaline phosphatase (APISO): 38 U/L (ref 31–125)
BUN: 12 mg/dL (ref 7–25)
CO2: 27 mmol/L (ref 20–32)
Calcium: 9.7 mg/dL (ref 8.6–10.2)
Chloride: 102 mmol/L (ref 98–110)
Creat: 0.69 mg/dL (ref 0.50–1.10)
Globulin: 2.1 g/dL (calc) (ref 1.9–3.7)
Glucose, Bld: 88 mg/dL (ref 65–99)
Potassium: 5.2 mmol/L (ref 3.5–5.3)
Sodium: 137 mmol/L (ref 135–146)
Total Bilirubin: 0.4 mg/dL (ref 0.2–1.2)
Total Protein: 7 g/dL (ref 6.1–8.1)

## 2021-03-30 LAB — CBC
HCT: 43.3 % (ref 35.0–45.0)
Hemoglobin: 14.1 g/dL (ref 11.7–15.5)
MCH: 29.6 pg (ref 27.0–33.0)
MCHC: 32.6 g/dL (ref 32.0–36.0)
MCV: 91 fL (ref 80.0–100.0)
MPV: 11.1 fL (ref 7.5–12.5)
Platelets: 275 10*3/uL (ref 140–400)
RBC: 4.76 10*6/uL (ref 3.80–5.10)
RDW: 11.8 % (ref 11.0–15.0)
WBC: 6.9 10*3/uL (ref 3.8–10.8)

## 2021-03-30 NOTE — Patient Instructions (Signed)

## 2022-03-24 NOTE — Progress Notes (Signed)
41 y.o. [id number] Married White or Caucasian Not Hispanic or Latino female here for annual exam.   ?Period Cycle (Days): 28 ?Period Duration (Days): 4 ?Period Pattern: Regular ?Menstrual Flow: Moderate ?Menstrual Control: Tampon ?Menstrual Control Change Freq (Hours): 6 ?Dysmenorrhea: (!) Mild ?Dysmenorrhea Symptoms: Cramping ? ?Patient's last menstrual period was 03/13/2022.          ?Sexually active: Yes.    ?The current method of family planning is vasectomy.    ?Exercising: Yes.    Home exercise routine includes Running and light weights . ?Smoker:  no ? ?Health Maintenance: ?Pap:  01/20/17 Neg HR HPV Neg   01-16-15 neg ?History of abnormal Pap:  no ?MMG:  none  ?BMD:   none  ?Colonoscopy: 11/23/12, routine f/u ?TDaP:  2018  ?Gardasil: none  ? ? reports that she has never smoked. She has never used smokeless tobacco. She reports current alcohol use of about 3.0 standard drinks per week. She reports that she does not use drugs. 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 Husband is a Dealer.  ? ?Past Medical History:  ?Diagnosis Date  ? Blood in stool   ? Chicken pox   ? Enlarged thyroid   ? Frequent headaches   ? GERD (gastroesophageal reflux disease) 2003  ? IBS (irritable bowel syndrome)   ? Multinodular goiter   ? UTI (urinary tract infection)   ? ? ?Past Surgical History:  ?Procedure Laterality Date  ? DILATION AND CURETTAGE OF UTERUS  06/2007  ? [place]; Age 44  ? ? ?Current Outpatient Medications  ?Medication Sig Dispense Refill  ? Cholecalciferol (VITAMIN D PO) Take 2,000 Int'l Units by mouth daily.    ? Multiple Vitamins-Minerals (MULTIVITAMIN PO) Take 1 tablet by mouth daily.     ? vitamin C (ASCORBIC ACID) 500 MG tablet Take 1 tablet once a day    ? ?No current facility-administered medications for this visit.  ? ? ?Family History  ?Problem Relation Age of Onset  ? Irritable bowel syndrome Father   ? Hypertension Mother   ? Spina bifida Sister   ? Ulcerative  colitis Paternal Grandmother   ? Alcohol abuse Paternal Grandmother   ? Diabetes Maternal Grandmother   ? Stroke Maternal Grandmother   ? Cancer Maternal Grandfather   ? Hearing loss Paternal Grandfather   ? ? ?Review of Systems  ?All other systems reviewed and are negative. ? ?Exam:   ?BP 122/68   Pulse 87   Ht '5\' 5"'$  (1.651 m)   Wt 144 lb (65.3 kg)   LMP 03/13/2022   SpO2 99%   BMI 23.96 kg/m?   Weight change: '@WEIGHTCHANGE'$ @ Height:   Height: '5\' 5"'$  (165.1 cm)  ?Ht Readings from Last 3 Encounters:  ?03/31/22 '5\' 5"'$  (1.651 m)  ?03/30/21 '5\' 5"'$  (1.651 m)  ?02/18/20 '5\' 5"'$  (1.651 m)  ? ? ?General appearance: alert, cooperative and appears stated age ?Head: Normocephalic, without obvious abnormality, atraumatic ?Neck: no adenopathy, supple, symmetrical, trachea midline and thyroid normal to inspection and palpation ?Lungs: clear to auscultation bilaterally ?Cardiovascular: regular rate and rhythm ?Breasts: normal appearance, no masses or tenderness ?Abdomen: soft, non-tender; non distended,  no masses,  no organomegaly ?Extremities: extremities normal, atraumatic, no cyanosis or edema ?Skin: Skin color, texture, turgor normal. No rashes or lesions ?Lymph nodes: Cervical, supraclavicular, and axillary nodes normal. ?No abnormal inguinal nodes palpated ?Neurologic: Grossly normal ? ? ?Pelvic: External genitalia:  no lesions ?  Urethra:  normal appearing urethra with no masses, tenderness or lesions ?             Bartholins and Skenes: normal    ?             Vagina: normal appearing vagina with normal color and discharge, no lesions ?             Cervix: no lesions ?              ?Bimanual Exam:  Uterus:  normal size, contour, position, consistency, mobility, non-tender ?             Adnexa: no mass, fullness, tenderness ?              Rectovaginal: Confirms ?              Anus:  normal sphincter tone, no lesions ? ?1. Well woman exam ?Discussed breast self exam ?Discussed calcium and vit D intake ?No labs  this year ?Mammogram after she turns 41 ? ?2. Screening for cervical cancer ?- Cytology - PAP ? ? ? ? ?

## 2022-03-31 ENCOUNTER — Ambulatory Visit (INDEPENDENT_AMBULATORY_CARE_PROVIDER_SITE_OTHER): Payer: No Typology Code available for payment source | Admitting: Obstetrics and Gynecology

## 2022-03-31 ENCOUNTER — Encounter: Payer: Self-pay | Admitting: Obstetrics and Gynecology

## 2022-03-31 ENCOUNTER — Other Ambulatory Visit (HOSPITAL_COMMUNITY)
Admission: RE | Admit: 2022-03-31 | Discharge: 2022-03-31 | Disposition: A | Discharge status: Home / Self Care | Payer: No Typology Code available for payment source | Source: Ambulatory Visit | Attending: Obstetrics and Gynecology | Admitting: Obstetrics and Gynecology

## 2022-03-31 VITALS — BP 122/68 | HR 87 | Ht 65.0 in | Wt 144.0 lb

## 2022-03-31 DIAGNOSIS — Z124 Encounter for screening for malignant neoplasm of cervix: Secondary | ICD-10-CM | POA: Diagnosis present

## 2022-03-31 DIAGNOSIS — Z01419 Encounter for gynecological examination (general) (routine) without abnormal findings: Secondary | ICD-10-CM

## 2022-03-31 NOTE — Patient Instructions (Signed)

## 2022-04-04 LAB — CYTOLOGY - PAP
Comment: NEGATIVE
Diagnosis: NEGATIVE
Diagnosis: REACTIVE
High risk HPV: NEGATIVE

## 2022-05-20 ENCOUNTER — Ambulatory Visit
Admission: EM | Admit: 2022-05-20 | Discharge: 2022-05-20 | Disposition: A | Discharge status: Home / Self Care | Payer: No Typology Code available for payment source | Attending: Family Medicine | Admitting: Family Medicine

## 2022-05-20 ENCOUNTER — Encounter: Payer: Self-pay | Admitting: Emergency Medicine

## 2022-05-20 DIAGNOSIS — N390 Urinary tract infection, site not specified: Secondary | ICD-10-CM | POA: Diagnosis present

## 2022-05-20 LAB — POCT URINALYSIS DIP (MANUAL ENTRY)
Bilirubin, UA: NEGATIVE
Glucose, UA: NEGATIVE mg/dL
Nitrite, UA: POSITIVE — AB
Protein Ur, POC: NEGATIVE mg/dL
Spec Grav, UA: 1.01 (ref 1.010–1.025)
Urobilinogen, UA: 0.2 E.U./dL
pH, UA: 7 (ref 5.0–8.0)

## 2022-05-20 MED ORDER — SULFAMETHOXAZOLE-TRIMETHOPRIM 800-160 MG PO TABS: 1.0000 | ORAL_TABLET | Freq: Two times a day (BID) | ORAL | 0 refills | Status: DC

## 2022-05-20 NOTE — ED Triage Notes (Signed)
Odor to urine yesterday and burning on urination with urinary frequency.

## 2022-05-20 NOTE — ED Provider Notes (Signed)
[facility]    CSN: [id number] Arrival date & time: 05/20/22  4034      History   Chief Complaint Chief Complaint  Patient presents with   Urinary Frequency    Possible UTI - Entered by patient    HPI Sherry Chavez is a 41 y.o. female.   Presenting today with 2-day history of strong urine odor, urinary frequency, urgency, dysuria, blood with wiping.  Denies known fever, nausea, vomiting, back pain, bowel changes, vaginal symptoms.  Tried Azo last night with only minimal relief.  No concern for STIs or pregnancy.  LMP 05/05/2022.    Past Medical History:  Diagnosis Date   Blood in stool    Chicken pox    Enlarged thyroid    Frequent headaches    GERD (gastroesophageal reflux disease) 2003   IBS (irritable bowel syndrome)    Multinodular goiter    UTI (urinary tract infection)     Patient Active Problem List   Diagnosis Date Noted   Multinodular goiter    Diarrhea 07/02/2019   Chronic left-sided low back pain with left-sided sciatica 12/13/2018   Routine general medical examination at a health care facility 05/18/2018   Multinodular goiter (nontoxic) 05/02/2017   Irritable bowel syndrome 01/03/2014    Past Surgical History:  Procedure Laterality Date   DILATION AND CURETTAGE OF UTERUS  06/2007   TONSILLECTOMY AND ADENOIDECTOMY  1998; Age 28    OB History     Gravida  5   Para  3   Term  3   Preterm  0   AB  2   Living  3      SAB  2   IAB  0   Ectopic  0   Multiple  0   Live Births  3            Home Medications    Prior to Admission medications   Medication Sig Start Date End Date Taking? Authorizing Provider  sulfamethoxazole-trimethoprim (BACTRIM DS) 800-160 MG tablet Take 1 tablet by mouth 2 (two) times daily. 05/20/22  Yes Volney American, PA-C  Cholecalciferol (VITAMIN D PO) Take 2,000 Int'l Units by mouth daily.    [provider]  Multiple Vitamins-Minerals (MULTIVITAMIN PO) Take 1 tablet by  mouth daily.     [provider]  vitamin C (ASCORBIC ACID) 500 MG tablet Take 1 tablet once a day    [provider]    Family History Family History  Problem Relation Age of Onset   Irritable bowel syndrome Father    Hypertension Mother    Spina bifida Sister    Ulcerative colitis Paternal Grandmother    Alcohol abuse Paternal Grandmother    Diabetes Maternal Grandmother    Stroke Maternal Grandmother    Cancer Maternal Grandfather    Hearing loss Paternal Grandfather     Social History Social History   Tobacco Use   Smoking status: Never   Smokeless tobacco: Never  Vaping Use   Vaping Use: Never used  Substance Use Topics   Alcohol use: Yes    Alcohol/week: 3.0 standard drinks of alcohol    Types: 3 Standard drinks or equivalent per week   Drug use: No     Allergies   Codeine   Review of Systems Review of Systems Per HPI  Physical Exam Triage Vital Signs ED Triage Vitals  Enc Vitals Group     BP 05/20/22 0830 113/71     Pulse  Rate 05/20/22 0830 98     Resp 05/20/22 0830 18     Temp 05/20/22 0830 98.8 F (37.1 C)     Temp Source 05/20/22 0830 Oral     SpO2 05/20/22 0830 98 %     Weight --      Height --      Head Circumference --      Peak Flow --      Pain Score 05/20/22 0831 5     Pain Loc --      Pain Edu? --      Excl. in [place]? --    No data found.  Updated Vital Signs BP 113/71 (BP Location: Right Arm)   Pulse 98   Temp 98.8 F (37.1 C) (Oral)   Resp 18   LMP 05/05/2022 (Exact Date)   SpO2 98%   Visual Acuity Right Eye Distance:   Left Eye Distance:   Bilateral Distance:    Right Eye Near:   Left Eye Near:    Bilateral Near:     Physical Exam Vitals and nursing note reviewed.  Constitutional:      Appearance: Normal appearance. She is not ill-appearing.  HENT:     Head: Atraumatic.  Eyes:     Extraocular Movements: Extraocular movements intact.     Conjunctiva/sclera: Conjunctivae normal.   Cardiovascular:     Rate and Rhythm: Normal rate and regular rhythm.     Heart sounds: Normal heart sounds.  Pulmonary:     Effort: Pulmonary effort is normal.     Breath sounds: Normal breath sounds.  Abdominal:     General: Bowel sounds are normal. There is no distension.     Palpations: Abdomen is soft.     Tenderness: There is no abdominal tenderness. There is no right CVA tenderness, left CVA tenderness or guarding.  Musculoskeletal:        General: Normal range of motion.     Cervical back: Normal range of motion and neck supple.  Skin:    General: Skin is warm and dry.  Neurological:     Mental Status: She is alert and oriented to person, place, and time.  Psychiatric:        Mood and Affect: Mood normal.        Thought Content: Thought content normal.        Judgment: Judgment normal.    UC Treatments / Results  Labs (all labs ordered are listed, but only abnormal results are displayed) Labs Reviewed  POCT URINALYSIS DIP (MANUAL ENTRY) - Abnormal; Notable for the following components:      Result Value   Ketones, POC UA small (15) (*)    Blood, UA large (*)    Nitrite, UA Positive (*)    Leukocytes, UA Large (3+) (*)    All other components within normal limits  URINE CULTURE    EKG   Radiology No results found.  Procedures Procedures (including critical care time)  Medications Ordered in UC Medications - No data to display  Initial Impression / Assessment and Plan / UC Course  I have reviewed the triage vital signs and the nursing notes.  Pertinent labs & imaging results that were available during my care of the patient were reviewed by me and considered in my medical decision making (see chart for details).     UA showing evidence of urinary tract infection, urine culture pending, treat with Bactrim and fluids while awaiting results.  Return for worsening symptoms.  Final Clinical Impressions(s) / UC Diagnoses   Final diagnoses:  Acute lower  UTI   Discharge Instructions   None    ED Prescriptions     Medication Sig Dispense Auth. Provider   sulfamethoxazole-trimethoprim (BACTRIM DS) 800-160 MG tablet Take 1 tablet by mouth 2 (two) times daily. 6 tablet Volney American, [place]      PDMP not reviewed this encounter.   Merrie Roof [place], [place] 05/20/22 [phone]

## 2022-05-23 LAB — URINE CULTURE: Culture: >=100000 — AB

## 2022-10-31 ENCOUNTER — Other Ambulatory Visit: Payer: Self-pay | Admitting: Obstetrics and Gynecology

## 2022-10-31 DIAGNOSIS — Z1231 Encounter for screening mammogram for malignant neoplasm of breast: Secondary | ICD-10-CM

## 2022-11-02 ENCOUNTER — Ambulatory Visit
Admission: RE | Admit: 2022-11-02 | Discharge: 2022-11-02 | Disposition: A | Discharge status: Home / Self Care | Payer: No Typology Code available for payment source | Source: Ambulatory Visit | Attending: Obstetrics and Gynecology | Admitting: Obstetrics and Gynecology

## 2022-11-02 DIAGNOSIS — Z1231 Encounter for screening mammogram for malignant neoplasm of breast: Secondary | ICD-10-CM

## 2023-04-03 NOTE — Progress Notes (Unsigned)
42 y.o. [id number] Married White or Caucasian Not Hispanic or Latino female here for annual exam.   Period Cycle (Days): 28 Period Duration (Days): 3 Period Pattern: Regular Menstrual Flow: Moderate Menstrual Control: Tampon Menstrual Control Change Freq (Hours): 6 Dysmenorrhea: (!) Mild Dysmenorrhea Symptoms: Cramping  H/O IBS, tolerable. No bladder c/o (other than rare GSI).   Patient's last menstrual period was 03/20/2023.          Sexually active: Yes.    The current method of family planning is vasectomy.    Exercising: Yes.     Weights and running  Smoker:  no  Health Maintenance: Pap:  03/31/22 WNL Hr HPV Neg; 01/20/17 Neg HR HPV Neg   01-16-15 neg History of abnormal Pap:  no MMG:  11/03/22 density C Bi-rads 1 neg BMD:   n/a Colonoscopy: 11/23/12, routine f/u TDaP:  2018  Gardasil: none    reports that she has never smoked. She has never used smokeless tobacco. She reports current alcohol use of about 3.0 standard drinks of alcohol per week. She reports that she does not use drugs. 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 Husband is a Curator.   Past Medical History:  Diagnosis Date   Blood in stool    Chicken pox    Enlarged thyroid    Frequent headaches    GERD (gastroesophageal reflux disease) 2003   IBS (irritable bowel syndrome)    Multinodular goiter    UTI (urinary tract infection)     Past Surgical History:  Procedure Laterality Date   DILATION AND CURETTAGE OF UTERUS  06/2007   TONSILLECTOMY AND ADENOIDECTOMY  1998; Age 50    Current Outpatient Medications  Medication Sig Dispense Refill   Cholecalciferol (VITAMIN D PO) Take 2,000 Int'l Units by mouth daily.     Multiple Vitamins-Minerals (MULTIVITAMIN PO) Take 1 tablet by mouth daily.      vitamin C (ASCORBIC ACID) 500 MG tablet Take 1 tablet once a day     No current facility-administered medications for this visit.    Family History  Problem Relation Age of Onset   Irritable  bowel syndrome Father    Hypertension Mother    Spina bifida Sister    Ulcerative colitis Paternal Grandmother    Alcohol abuse Paternal Grandmother    Diabetes Maternal Grandmother    Stroke Maternal Grandmother    Cancer Maternal Grandfather    Hearing loss Paternal Grandfather     Review of Systems  All other systems reviewed and are negative.   Exam:   BP 120/74   Pulse 78   Ht 5' 5.5" (1.664 m)   Wt 150 lb (68 kg)   LMP 03/20/2023   SpO2 99%   BMI 24.58 kg/m   Weight change: @WEIGHTCHANGE @ Height:   Height: 5' 5.5" (166.4 cm)  Ht Readings from Last 3 Encounters:  04/05/23 5' 5.5" (1.664 m)  03/31/22 5\' 5"  (1.651 m)  03/30/21 5\' 5"  (1.651 m)    General appearance: alert, cooperative and appears stated age Head: Normocephalic, without obvious abnormality, atraumatic Neck: no adenopathy, supple, symmetrical, trachea midline and thyroid normal to inspection and palpation Lungs: clear to auscultation bilaterally Cardiovascular: regular rate and rhythm Breasts: normal appearance, no masses or tenderness Abdomen: soft, non-tender; non distended,  no masses,  no organomegaly Extremities: extremities normal, atraumatic, no cyanosis or edema Skin: Skin color, texture, turgor normal. No rashes or lesions Lymph nodes: Cervical, supraclavicular, and axillary nodes normal. No abnormal inguinal  nodes palpated Neurologic: Grossly normal   Pelvic: External genitalia:  no lesions              Urethra:  normal appearing urethra with no masses, tenderness or lesions              Bartholins and Skenes: normal                 Vagina: normal appearing vagina with normal color and discharge, no lesions              Cervix: no lesions               Bimanual Exam:  Uterus:  normal size, contour, position, consistency, mobility, non-tender              Adnexa: no mass, fullness, tenderness               Rectovaginal: Confirms               Anus:  normal sphincter tone, no  lesions  Carolynn Serve, CMA chaperoned for the exam.  1. Well woman exam Discussed breast self exam Discussed calcium and vit D intake Mammogram and pap are UTD  2. Multinodular goiter (nontoxic) Needs yearly TSH - TSH

## 2023-04-05 ENCOUNTER — Encounter: Payer: Self-pay | Admitting: Obstetrics and Gynecology

## 2023-04-05 ENCOUNTER — Ambulatory Visit (INDEPENDENT_AMBULATORY_CARE_PROVIDER_SITE_OTHER): Payer: 59 | Admitting: Obstetrics and Gynecology

## 2023-04-05 VITALS — BP 120/74 | HR 78 | Ht 65.5 in | Wt 150.0 lb

## 2023-04-05 DIAGNOSIS — Z01419 Encounter for gynecological examination (general) (routine) without abnormal findings: Secondary | ICD-10-CM | POA: Diagnosis not present

## 2023-04-05 DIAGNOSIS — E042 Nontoxic multinodular goiter: Secondary | ICD-10-CM | POA: Diagnosis not present

## 2023-04-05 NOTE — Patient Instructions (Signed)

## 2023-04-06 LAB — TSH: TSH: 1 mIU/L

## 2023-05-01 ENCOUNTER — Ambulatory Visit
Admission: EM | Admit: 2023-05-01 | Discharge: 2023-05-01 | Disposition: A | Discharge status: Home / Self Care | Payer: 59 | Attending: Nurse Practitioner | Admitting: Nurse Practitioner

## 2023-05-01 DIAGNOSIS — N3001 Acute cystitis with hematuria: Secondary | ICD-10-CM | POA: Insufficient documentation

## 2023-05-01 LAB — POCT URINALYSIS DIP (MANUAL ENTRY)
Bilirubin, UA: NEGATIVE
Glucose, UA: NEGATIVE mg/dL
Ketones, POC UA: NEGATIVE mg/dL
Nitrite, UA: NEGATIVE
Protein Ur, POC: NEGATIVE mg/dL
Spec Grav, UA: 1.015 (ref 1.010–1.025)
Urobilinogen, UA: 0.2 E.U./dL
pH, UA: 7.5 (ref 5.0–8.0)

## 2023-05-01 MED ORDER — PHENAZOPYRIDINE HCL 100 MG PO TABS: 100.0000 mg | ORAL_TABLET | Freq: Three times a day (TID) | ORAL | 0 refills | Status: DC | PRN

## 2023-05-01 MED ORDER — SULFAMETHOXAZOLE-TRIMETHOPRIM 800-160 MG PO TABS: 1.0000 | ORAL_TABLET | Freq: Two times a day (BID) | ORAL | 0 refills | Status: AC

## 2023-05-01 NOTE — ED Triage Notes (Signed)
Pt reports she had a UTI one week ago and now she Is having sxs of a uti again. [facility] gave [facility] macrobid which helped slightly.  This morning she has odor, burning with urination, low abdominal pain, and frequent urination.

## 2023-05-01 NOTE — ED Provider Notes (Signed)
[facility]    CSN: [id number] Arrival date & time: 05/01/23  0809      History   Chief Complaint Chief Complaint  Patient presents with   Urinary Frequency    Uti symptoms - Entered by patient    HPI Sherry Chavez is a 42 y.o. female.   Patient presents today for 8-day history of dysuria, foul urinary odor, hematuria, urinary frequency and urgency, voiding smaller amounts, and lower abdominal pain.  She denies fever, nausea/vomiting, new flank pain, abdominal or pelvic pain, and vaginal discharge.  Reports she was initially seen via an e-visit and treated with Macrobid which did seem to help the symptoms.  She completed the course of Macrobid 3 days ago and reports symptoms have been worsening the past couple of days.  She noticed hematuria yesterday.  Patient denies history of recurrent UTI.  Reports last UTI prior to this episode was only 12 months ago.  No antibiotic allergies.  Reports she was treated for sinus infection a couple months ago with Augmentin and has only taken Macrobid in the past 90 days.    Past Medical History:  Diagnosis Date   Blood in stool    Chicken pox    Enlarged thyroid    Frequent headaches    GERD (gastroesophageal reflux disease) 2003   IBS (irritable bowel syndrome)    Multinodular goiter    UTI (urinary tract infection)     Patient Active Problem List   Diagnosis Date Noted   Multinodular goiter    Diarrhea 07/02/2019   Routine general medical examination at a health care facility 05/18/2018   Multinodular goiter (nontoxic) 05/02/2017   Irritable bowel syndrome 01/03/2014    Past Surgical History:  Procedure Laterality Date   DILATION AND CURETTAGE OF UTERUS  06/2007   TONSILLECTOMY AND ADENOIDECTOMY  1998; Age 30    OB History     Gravida  5   Para  3   Term  3   Preterm  0   AB  2   Living  3      SAB  2   IAB  0   Ectopic  0   Multiple  0   Live Births  3            Home  Medications    Prior to Admission medications   Medication Sig Start Date End Date Taking? Authorizing Provider  phenazopyridine (PYRIDIUM) 100 MG tablet Take 1 tablet (100 mg total) by mouth 3 (three) times daily as needed for pain (bladder pain). 05/01/23  Yes Valentino Nose, NP  sulfamethoxazole-trimethoprim (BACTRIM DS) 800-160 MG tablet Take 1 tablet by mouth 2 (two) times daily for 3 days. 05/01/23 05/04/23 Yes Valentino Nose, NP  Cholecalciferol (VITAMIN D PO) Take 2,000 Int'l Units by mouth daily.    [provider]  Multiple Vitamins-Minerals (MULTIVITAMIN PO) Take 1 tablet by mouth daily.     [provider]  vitamin C (ASCORBIC ACID) 500 MG tablet Take 1 tablet once a day    [provider]    Family History Family History  Problem Relation Age of Onset   Irritable bowel syndrome Father    Hypertension Mother    Spina bifida Sister    Ulcerative colitis Paternal Grandmother    Alcohol abuse Paternal Grandmother    Diabetes Maternal Grandmother    Stroke Maternal Grandmother    Cancer Maternal Grandfather    Hearing loss Paternal Grandfather  Social History Social History   Tobacco Use   Smoking status: Never   Smokeless tobacco: Never  Vaping Use   Vaping Use: Never used  Substance Use Topics   Alcohol use: Yes    Alcohol/week: 3.0 standard drinks of alcohol    Types: 3 Standard drinks or equivalent per week   Drug use: No     Allergies   Codeine   Review of Systems Review of Systems Per HPI  Physical Exam Triage Vital Signs ED Triage Vitals [05/01/23 0818]  Enc Vitals Group     BP 114/72     Pulse Rate 80     Resp 20     Temp 97.9 F (36.6 C)     Temp Source Oral     SpO2 98 %     Weight      Height      Head Circumference      Peak Flow      Pain Score 0     Pain Loc      Pain Edu?      Excl. in GC?    No data found.  Updated Vital Signs BP 114/72 (BP Location: Right Arm)   Pulse 80   Temp 97.9  F (36.6 C) (Oral)   Resp 20   LMP 04/14/2023 (Exact Date)   SpO2 98%   Visual Acuity Right Eye Distance:   Left Eye Distance:   Bilateral Distance:    Right Eye Near:   Left Eye Near:    Bilateral Near:     Physical Exam Vitals and nursing note reviewed.  Constitutional:      General: She is not in acute distress.    Appearance: She is not toxic-appearing.  HENT:     Mouth/Throat:     Mouth: Mucous membranes are moist.     Pharynx: Oropharynx is clear.  Cardiovascular:     Rate and Rhythm: Normal rate and regular rhythm.  Pulmonary:     Effort: Pulmonary effort is normal. No respiratory distress.  Abdominal:     General: Abdomen is flat. Bowel sounds are normal. There is no distension.     Palpations: Abdomen is soft. There is no mass.     Tenderness: There is no abdominal tenderness. There is no right CVA tenderness, left CVA tenderness or guarding.  Skin:    General: Skin is warm and dry.     Coloration: Skin is not jaundiced or pale.     Findings: No erythema.  Neurological:     Mental Status: She is alert and oriented to person, place, and time.  Psychiatric:        Behavior: Behavior is cooperative.      UC Treatments / Results  Labs (all labs ordered are listed, but only abnormal results are displayed) Labs Reviewed  POCT URINALYSIS DIP (MANUAL ENTRY) - Abnormal; Notable for the following components:      Result Value   Color, UA light yellow (*)    Blood, UA moderate (*)    Leukocytes, UA Small (1+) (*)    All other components within normal limits  URINE CULTURE    EKG   Radiology No results found.  Procedures Procedures (including critical care time)  Medications Ordered in UC Medications - No data to display  Initial Impression / Assessment and Plan / UC Course  I have reviewed the triage vital signs and the nursing notes.  Pertinent labs & imaging results that were available during my  care of the patient were reviewed by me and  considered in my medical decision making (see chart for details).   Patient is well-appearing, normotensive, afebrile, not tachycardic, not tachypneic, oxygenating well on room air.    1. Acute cystitis with hematuria Urinalysis today shows moderate blood, 1+ leukocyte Estrace No CVA tenderness, low suspicion for nephrolithiasis Will treat for acute cystitis with Bactrim DS twice daily, Pyridium as needed for pain Urine culture is pending Strict ER and return precautions discussed with patient  The patient was given the opportunity to ask questions.  All questions answered to their satisfaction.  The patient is in agreement to this plan.    Final Clinical Impressions(s) / UC Diagnoses   Final diagnoses:  Acute cystitis with hematuria     Discharge Instructions      It sounds like you have a urinary tract infection.  Please take the Bactrim as prescribed to treat it.  We are sending for a urine culture and will call you later this week if we need to change the antibiotic.  In the meantime, continue plenty of hydration with water and use the Pyridium as needed to help with bladder pain.  Seek care for worsening symptoms despite treatment.    ED Prescriptions     Medication Sig Dispense Auth. Provider   sulfamethoxazole-trimethoprim (BACTRIM DS) 800-160 MG tablet Take 1 tablet by mouth 2 (two) times daily for 3 days. 6 tablet Cathlean Marseilles A, NP   phenazopyridine (PYRIDIUM) 100 MG tablet Take 1 tablet (100 mg total) by mouth 3 (three) times daily as needed for pain (bladder pain). 10 tablet Valentino Nose, NP      PDMP not reviewed this encounter.   Valentino Nose, NP 05/01/23 [id number]

## 2023-05-01 NOTE — Discharge Instructions (Addendum)
It sounds like you have a urinary tract infection.  Please take the Bactrim as prescribed to treat it.  We are sending for a urine culture and will call you later this week if we need to change the antibiotic.  In the meantime, continue plenty of hydration with water and use the Pyridium as needed to help with bladder pain.  Seek care for worsening symptoms despite treatment.

## 2023-05-03 LAB — URINE CULTURE: Culture: <10000 — AB

## 2023-06-22 ENCOUNTER — Ambulatory Visit
Admission: RE | Admit: 2023-06-22 | Discharge: 2023-06-22 | Disposition: A | Discharge status: Home / Self Care | Payer: 59 | Source: Ambulatory Visit | Attending: Family Medicine | Admitting: Family Medicine

## 2023-06-22 VITALS — BP 108/68 | HR 109 | Temp 98.8°F | Resp 16

## 2023-06-22 DIAGNOSIS — L089 Local infection of the skin and subcutaneous tissue, unspecified: Secondary | ICD-10-CM | POA: Diagnosis not present

## 2023-06-22 MED ORDER — NYSTATIN 100000 UNIT/GM EX CREA: TOPICAL_CREAM | CUTANEOUS | 0 refills | Status: DC

## 2023-06-22 MED ORDER — CEPHALEXIN 500 MG PO CAPS: 500.0000 mg | ORAL_CAPSULE | Freq: Two times a day (BID) | ORAL | 0 refills | Status: DC

## 2023-06-22 NOTE — ED Triage Notes (Signed)
Pt recently went to the beach and now has a rash in between her right big toe and 2nd toe that started 2 weeks ago. Has tried antifungal medication with no relief.

## 2023-06-22 NOTE — ED Provider Notes (Signed)
[facility]    CSN: [id number] Arrival date & time: 06/22/23  1044      History   Chief Complaint Chief Complaint  Patient presents with   Rash    Rash, skin lesion between two toes; developed after visiting the beach - Entered by patient    HPI Sherry Chavez is a 42 y.o. female.   Patient presenting today with 2-week history of a rash that started between her right big toe and second toe after a beach trip.  She states initially it was itchy and cracking skin so she thought it was fungal and use some antifungal cream.  This did not seem to help and the issue has been worsening and she is now having streaking redness up the dorsal foot.  Denies fever, chills, drainage, nausea, vomiting, body aches.    Past Medical History:  Diagnosis Date   Blood in stool    Chicken pox    Enlarged thyroid    Frequent headaches    GERD (gastroesophageal reflux disease) 2003   IBS (irritable bowel syndrome)    Multinodular goiter    UTI (urinary tract infection)     Patient Active Problem List   Diagnosis Date Noted   Multinodular goiter    Diarrhea 07/02/2019   Routine general medical examination at a health care facility 05/18/2018   Multinodular goiter (nontoxic) 05/02/2017   Irritable bowel syndrome 01/03/2014    Past Surgical History:  Procedure Laterality Date   DILATION AND CURETTAGE OF UTERUS  06/2007   TONSILLECTOMY AND ADENOIDECTOMY  1998; Age 45    OB History     Gravida  5   Para  3   Term  3   Preterm  0   AB  2   Living  3      SAB  2   IAB  0   Ectopic  0   Multiple  0   Live Births  3            Home Medications    Prior to Admission medications   Medication Sig Start Date End Date Taking? Authorizing Provider  cephALEXin (KEFLEX) 500 MG capsule Take 1 capsule (500 mg total) by mouth 2 (two) times daily. 06/22/23  Yes Particia Nearing, PA-C  Cholecalciferol (VITAMIN D PO) Take 2,000 Int'l Units by mouth daily.    Yes [provider]  Multiple Vitamins-Minerals (MULTIVITAMIN PO) Take 1 tablet by mouth daily.    Yes [provider]  nystatin cream (MYCOSTATIN) Apply to affected area 2 times daily 06/22/23  Yes Particia Nearing, PA-C  vitamin C (ASCORBIC ACID) 500 MG tablet Take 1 tablet once a day   Yes [provider]  phenazopyridine (PYRIDIUM) 100 MG tablet Take 1 tablet (100 mg total) by mouth 3 (three) times daily as needed for pain (bladder pain). 05/01/23   Valentino Nose, NP    Family History Family History  Problem Relation Age of Onset   Irritable bowel syndrome Father    Hypertension Mother    Spina bifida Sister    Ulcerative colitis Paternal Grandmother    Alcohol abuse Paternal Grandmother    Diabetes Maternal Grandmother    Stroke Maternal Grandmother    Cancer Maternal Grandfather    Hearing loss Paternal Grandfather     Social History Social History   Tobacco Use   Smoking status: Never   Smokeless tobacco: Never  Vaping Use   Vaping status: Never Used  Substance Use Topics   Alcohol use: Yes    Alcohol/week: 3.0 standard drinks of alcohol    Types: 3 Standard drinks or equivalent per week   Drug use: No     Allergies   Codeine   Review of Systems Review of Systems Per HPI  Physical Exam Triage Vital Signs ED Triage Vitals  Encounter Vitals Group     BP 06/22/23 1104 108/68     Systolic BP Percentile --      Diastolic BP Percentile --      Pulse Rate 06/22/23 1104 (!) 109     Resp 06/22/23 1104 16     Temp 06/22/23 1104 98.8 F (37.1 C)     Temp Source 06/22/23 1104 Oral     SpO2 06/22/23 1104 97 %     Weight --      Height --      Head Circumference --      Peak Flow --      Pain Score 06/22/23 1108 0     Pain Loc --      Pain Education --      Exclude from Growth Chart --    No data found.  Updated Vital Signs BP 108/68 (BP Location: Right Arm)   Pulse (!) 109   Temp 98.8 F (37.1 C) (Oral)   Resp 16    LMP 06/07/2023 (Exact Date)   SpO2 97%   Visual Acuity Right Eye Distance:   Left Eye Distance:   Bilateral Distance:    Right Eye Near:   Left Eye Near:    Bilateral Near:     Physical Exam Vitals and nursing note reviewed.  Constitutional:      Appearance: Normal appearance. She is not ill-appearing.  HENT:     Head: Atraumatic.  Eyes:     Extraocular Movements: Extraocular movements intact.     Conjunctiva/sclera: Conjunctivae normal.  Cardiovascular:     Rate and Rhythm: Normal rate and regular rhythm.     Heart sounds: Normal heart sounds.  Pulmonary:     Effort: Pulmonary effort is normal.     Breath sounds: Normal breath sounds.  Musculoskeletal:        General: Normal range of motion.     Cervical back: Normal range of motion and neck supple.  Skin:    General: Skin is warm.     Comments: Large area of cracking skin between the right great toe and second toe now with some dried yellow drainage and surrounding erythema, edema.  Some red streaking up the dorsal foot from this area  Neurological:     Mental Status: She is alert and oriented to person, place, and time.     Comments: Right foot neurovascularly intact  Psychiatric:        Mood and Affect: Mood normal.        Thought Content: Thought content normal.        Judgment: Judgment normal.      UC Treatments / Results  Labs (all labs ordered are listed, but only abnormal results are displayed) Labs Reviewed - No data to display  EKG   Radiology No results found.  Procedures Procedures (including critical care time)  Medications Ordered in UC Medications - No data to display  Initial Impression / Assessment and Plan / UC Course  I have reviewed the triage vital signs and the nursing notes.  Pertinent labs & imaging results that were available during my care of the  patient were reviewed by me and considered in my medical decision making (see chart for details).     Possibly this was  initially a yeast infection causing the cracking in the skin, now appears to be infected.  Will treat with nystatin cream, Keflex, Epsom salt soaks, elevation at rest.  Follow-up with PCP for recheck next week.  Final Clinical Impressions(s) / UC Diagnoses   Final diagnoses:  Foot infection   Discharge Instructions   None    ED Prescriptions     Medication Sig Dispense Auth. Provider   cephALEXin (KEFLEX) 500 MG capsule Take 1 capsule (500 mg total) by mouth 2 (two) times daily. 14 capsule Particia Nearing, [place]   nystatin cream (MYCOSTATIN) Apply to affected area 2 times daily 60 g Particia Nearing, [place]      PDMP not reviewed this encounter.   Particia Nearing, [place] 06/22/23 1307

## 2023-06-27 ENCOUNTER — Encounter: Payer: Self-pay | Admitting: Podiatry

## 2023-06-27 ENCOUNTER — Ambulatory Visit: Payer: 59 | Admitting: Podiatry

## 2023-06-27 DIAGNOSIS — B353 Tinea pedis: Secondary | ICD-10-CM

## 2023-06-27 NOTE — Progress Notes (Signed)
  Subjective:  Patient ID: Sherry Chavez, female    DOB: Jul 07, 1981,  MRN: [id number]  Chief Complaint  Patient presents with   Wound Check    Pt states she noticed a wound 2 weeks ago and it kept getting worse so se went to the ER and they sent her home with medication. She also has been soaking with water and epsom salt she states it is much better but she still wanted to check in with someone else     42 y.o. female presents with the above complaint. History confirmed with patient.  Feels he has made quite a bit of improvement.  She has 1 more day of Keflex  Objective:  Physical Exam: warm, good capillary refill, no trophic changes or ulcerative lesions, normal DP and PT pulses, normal sensory exam, and partial skin breakdown with scab and slight maceration with peeling skin first interspace, no active cellulitis drainage purulence or tenderness      Assessment:   1. Tinea pedis of right foot      Plan:  Patient was evaluated and treated and all questions answered.    We discussed that likely she had bacterial superinfection on pre-existing tinea pedis.  She should complete her Keflex course and continue the nystatin cream until it is completely resolved.  Discussed possibility of recurrence as well as the etiology of tinea pedis and how this could relate to possible onychomycosis I advised her to remove nail polish and inspect the nails for any possible fungal infection.  She will return as needed if this does not improve or worsens or has other issues.  Return if symptoms worsen or fail to improve.

## 2023-06-30 ENCOUNTER — Ambulatory Visit: Payer: 59 | Admitting: Podiatry

## 2023-08-03 ENCOUNTER — Ambulatory Visit: Payer: 59 | Admitting: Family Medicine

## 2023-09-27 ENCOUNTER — Other Ambulatory Visit: Payer: Self-pay | Admitting: Internal Medicine

## 2023-09-27 DIAGNOSIS — Z1231 Encounter for screening mammogram for malignant neoplasm of breast: Secondary | ICD-10-CM

## 2023-10-10 ENCOUNTER — Ambulatory Visit (INDEPENDENT_AMBULATORY_CARE_PROVIDER_SITE_OTHER): Payer: 59 | Admitting: Family Medicine

## 2023-10-10 ENCOUNTER — Encounter (HOSPITAL_BASED_OUTPATIENT_CLINIC_OR_DEPARTMENT_OTHER): Payer: Self-pay | Admitting: Family Medicine

## 2023-10-10 VITALS — BP 105/76 | HR 81 | Ht 65.5 in | Wt 151.4 lb

## 2023-10-10 DIAGNOSIS — Z13220 Encounter for screening for lipoid disorders: Secondary | ICD-10-CM | POA: Diagnosis not present

## 2023-10-10 DIAGNOSIS — E042 Nontoxic multinodular goiter: Secondary | ICD-10-CM

## 2023-10-10 NOTE — Patient Instructions (Signed)
Please return one morning for fasting labs.  Lab is open M-F 8-5. Closes for lunch from 12-1.

## 2023-10-10 NOTE — Progress Notes (Signed)
New Patient Office Visit  Subjective:   Sherry Chavez 06-Oct-1981 10/10/2023  Chief Complaint  Patient presents with   New Patient (Initial Visit)    Patient is here to get established with new pcp. Denies any concerns for today's visit.    HPI: Sherry Chavez presents today to establish care at [facility] at [facility]. Introduced to Publishing rights manager role and practice setting.  All questions answered.   Last PCP: Dr. Okey Dupre Last annual physical: Over 4 years ago- Has been seeing OBGYN for well woman exams Concerns: See below   Health Maintenance:  Patient sees OBGYN for well woman exams. Last exam on 04/05/23 and returns annually. Did not have labs drawn at that time and would like to have annually. She is UTD on screenings and is scheduled for Mammogram on 11/07/23.   Thyroid Goiter:  Patient remains asymptomatic. She repeats TSH annually. No further imaging required per Endocrinology note  from 10/21/2020.  Lab Results  Component Value Date   TSH 1.00 04/05/2023    The following portions of the patient's history were reviewed and updated as appropriate: past medical history, past surgical history, family history, social history, allergies, medications, and problem list.   Patient Active Problem List   Diagnosis Date Noted   Multinodular goiter    Routine general medical examination at a health care facility 05/18/2018   Multinodular goiter (nontoxic) 05/02/2017   Irritable bowel syndrome 01/03/2014   Past Medical History:  Diagnosis Date   Blood in stool    Chicken pox    Enlarged thyroid    Frequent headaches    GERD (gastroesophageal reflux disease) 2003   IBS (irritable bowel syndrome)    Multinodular goiter    UTI (urinary tract infection)    Past Surgical History:  Procedure Laterality Date   DILATION AND CURETTAGE OF UTERUS  06/05/2007   TONSILLECTOMY AND ADENOIDECTOMY  1998; Age 13   Family History   Problem Relation Age of Onset   Irritable bowel syndrome Father    Arthritis Father    Hypertension Mother    Varicose Veins Mother    Spina bifida Sister    Birth defects Sister    Ulcerative colitis Paternal Grandmother    Alcohol abuse Paternal Grandmother    Arthritis Paternal Grandmother    Diabetes Maternal Grandmother    Stroke Maternal Grandmother    Cancer Maternal Grandfather    Alcohol abuse Maternal Grandfather    Hearing loss Paternal Grandfather    Social History   Socioeconomic History   Marital status: Married    Spouse name: Not on file   Number of children: 3   Years of education: Not on file   Highest education level: Bachelor's degree (e.g., BA, AB, BS)  Occupational History   Occupation: home maker  Tobacco Use   Smoking status: Never   Smokeless tobacco: Never  Vaping Use   Vaping status: Never Used  Substance and Sexual Activity   Alcohol use: Yes    Alcohol/week: 1.0 standard drink of alcohol    Types: 1 Standard drinks or equivalent per week   Drug use: No   Sexual activity: Yes    Partners: Male    Birth control/protection: Surgical    Comment: Husband - vasectomy  Other Topics Concern   Not on file  Social History Narrative   Not on file   Social Determinants of Health   Financial Resource Strain: Low Risk  (  10/03/2023)   Overall Financial Resource Strain (CARDIA)    Difficulty of Paying Living Expenses: Not hard at all  Food Insecurity: No Food Insecurity (10/03/2023)   Hunger Vital Sign    Worried About Running Out of Food in the Last Year: Never true    Ran Out of Food in the Last Year: Never true  Transportation Needs: No Transportation Needs (10/03/2023)   PRAPARE - Administrator, Civil Service (Medical): No    Lack of Transportation (Non-Medical): No  Physical Activity: Insufficiently Active (10/03/2023)   Exercise Vital Sign    Days of Exercise per Week: 3 days    Minutes of Exercise per Session: 30 min   Stress: No Stress Concern Present (10/03/2023)   [employer] of Occupational Health - Occupational Stress Questionnaire    Feeling of Stress : Only a little  Social Connections: Socially Integrated (10/03/2023)   Social Connection and Isolation Panel [NHANES]    Frequency of Communication with Friends and Family: More than three times a week    Frequency of Social Gatherings with Friends and Family: Three times a week    Attends Religious Services: More than 4 times per year    Active Member of Clubs or Organizations: Yes    Attends Banker Meetings: More than 4 times per year    Marital Status: Married  Catering manager Violence: Not At Risk (10/10/2023)   Humiliation, Afraid, Rape, and Kick questionnaire    Fear of Current or Ex-Partner: No    Emotionally Abused: No    Physically Abused: No    Sexually Abused: No   Outpatient Medications Prior to Visit  Medication Sig Dispense Refill   Cholecalciferol (VITAMIN D PO) Take 2,000 Int'l Units by mouth daily.     Multiple Vitamins-Minerals (MULTIVITAMIN PO) Take 1 tablet by mouth daily.      vitamin C (ASCORBIC ACID) 500 MG tablet Take 1 tablet once a day     cephALEXin (KEFLEX) 500 MG capsule Take 1 capsule (500 mg total) by mouth 2 (two) times daily. 14 capsule 0   nystatin cream (MYCOSTATIN) Apply to affected area 2 times daily 60 g 0   No facility-administered medications prior to visit.   Allergies  Allergen Reactions   Codeine Nausea And Vomiting and Other (See Comments)    Stomach pain    ROS: A complete ROS was performed with pertinent positives/negatives noted in the HPI. The remainder of the ROS are negative.   Objective:   Today's Vitals   10/10/23 1436  BP: 105/76  Pulse: 81  SpO2: 100%  Weight: 151 lb 6.4 oz (68.7 kg)  Height: 5' 5.5" (1.664 m)    GENERAL: Well-appearing, in NAD. Well nourished.  SKIN: Pink, warm and dry. No rash, lesion, ulceration, or ecchymoses.  Head:  Normocephalic. NECK: Trachea midline. Full ROM w/o pain or tenderness.  RESPIRATORY: Chest wall symmetrical. Respirations even and non-labored. Breath sounds clear to auscultation bilaterally.  CARDIAC: S1, S2 present, regular rate and rhythm without murmur or gallops. Peripheral pulses 2+ bilaterally.  MSK: Muscle tone and strength appropriate for age.  EXTREMITIES: Without clubbing, cyanosis, or edema.  NEUROLOGIC: No motor or sensory deficits. Steady, even gait. C2-C12 intact.  PSYCH/MENTAL STATUS: Alert, oriented x 3. Cooperative, appropriate mood and affect.    No results found for any visits on 10/10/23.     Assessment & Plan:  1. Multinodular goiter (nontoxic) TSH stable. Will check CBC and CMP with fasting labs coming  up as part of annual screening for patient request.  - CBC with Differential/Platelet; Future - Comprehensive metabolic panel; Future  2. Screening cholesterol level Will obtain LP and CMP with fasting labs in the next week for annual cholesterol screening.  - Comprehensive metabolic panel; Future - Lipid panel; Future   Patient to reach out to office if new, worrisome, or unresolved symptoms arise or if no improvement in patient's condition. Patient verbalized understanding and is agreeable to treatment plan. All questions answered to patient's satisfaction.    Return in about 1 year (around 10/09/2024) for Thyroid follow up/follow up chronic conditions .    Yolanda Manges, FNP

## 2023-10-13 ENCOUNTER — Other Ambulatory Visit: Payer: Self-pay | Admitting: Medical Genetics

## 2023-10-13 DIAGNOSIS — Z006 Encounter for examination for normal comparison and control in clinical research program: Secondary | ICD-10-CM

## 2023-10-20 ENCOUNTER — Other Ambulatory Visit (HOSPITAL_BASED_OUTPATIENT_CLINIC_OR_DEPARTMENT_OTHER): Payer: Self-pay | Admitting: *Deleted

## 2023-10-20 DIAGNOSIS — E042 Nontoxic multinodular goiter: Secondary | ICD-10-CM

## 2023-10-20 DIAGNOSIS — Z13220 Encounter for screening for lipoid disorders: Secondary | ICD-10-CM

## 2023-10-21 LAB — CBC WITH DIFFERENTIAL/PLATELET
Basophils Absolute: 0 10*3/uL (ref 0.0–0.2)
Basos: 1 %
EOS (ABSOLUTE): 0.1 10*3/uL (ref 0.0–0.4)
Eos: 2 %
Hematocrit: 45.6 % (ref 34.0–46.6)
Hemoglobin: 14.4 g/dL (ref 11.1–15.9)
Immature Grans (Abs): 0 10*3/uL (ref 0.0–0.1)
Immature Granulocytes: 0 %
Lymphocytes Absolute: 1.9 10*3/uL (ref 0.7–3.1)
Lymphs: 39 %
MCH: 30.4 pg (ref 26.6–33.0)
MCHC: 31.6 g/dL (ref 31.5–35.7)
MCV: 96 fL (ref 79–97)
Monocytes Absolute: 0.5 10*3/uL (ref 0.1–0.9)
Monocytes: 10 %
Neutrophils Absolute: 2.4 10*3/uL (ref 1.4–7.0)
Neutrophils: 48 %
Platelets: 274 10*3/uL (ref 150–450)
RBC: 4.74 x10E6/uL (ref 3.77–5.28)
RDW: 12 % (ref 11.7–15.4)
WBC: 4.9 10*3/uL (ref 3.4–10.8)

## 2023-10-21 LAB — COMPREHENSIVE METABOLIC PANEL
ALT: 9 [IU]/L (ref 0–32)
AST: 15 [IU]/L (ref 0–40)
Albumin: 4.6 g/dL (ref 3.9–4.9)
Alkaline Phosphatase: 45 [IU]/L (ref 44–121)
BUN/Creatinine Ratio: 17 (ref 9–23)
BUN: 12 mg/dL (ref 6–24)
Bilirubin Total: 0.4 mg/dL (ref 0.0–1.2)
CO2: 21 mmol/L (ref 20–29)
Calcium: 9.6 mg/dL (ref 8.7–10.2)
Chloride: 104 mmol/L (ref 96–106)
Creatinine, Ser: 0.71 mg/dL (ref 0.57–1.00)
Globulin, Total: 2.1 g/dL (ref 1.5–4.5)
Glucose: 91 mg/dL (ref 70–99)
Potassium: 5.5 mmol/L — ABNORMAL HIGH (ref 3.5–5.2)
Sodium: 140 mmol/L (ref 134–144)
Total Protein: 6.7 g/dL (ref 6.0–8.5)
eGFR: 110 mL/min/{1.73_m2} (ref >59)

## 2023-10-21 LAB — LIPID PANEL
Chol/HDL Ratio: 2.9 ratio (ref 0.0–4.4)
Cholesterol, Total: 161 mg/dL (ref 100–199)
HDL: 55 mg/dL (ref >39)
LDL Chol Calc (NIH): 92 mg/dL (ref 0–99)
Triglycerides: 72 mg/dL (ref 0–149)
VLDL Cholesterol Cal: 14 mg/dL (ref 5–40)

## 2023-10-23 NOTE — Progress Notes (Signed)
Hi Katie,  Your labs look great overall. Your potassium is slightly elevated, please drink plenty of clear fluids throughout the day and stay well hydrated. Your cholesterol is excellent and your blood counts are normal. If you have further questions, please let me know.

## 2023-11-07 ENCOUNTER — Ambulatory Visit: Payer: 59

## 2023-11-14 ENCOUNTER — Ambulatory Visit
Admission: RE | Admit: 2023-11-14 | Discharge: 2023-11-14 | Disposition: A | Discharge status: Home / Self Care | Payer: 59 | Source: Ambulatory Visit | Attending: Internal Medicine | Admitting: Internal Medicine

## 2023-11-14 DIAGNOSIS — Z1231 Encounter for screening mammogram for malignant neoplasm of breast: Secondary | ICD-10-CM

## 2023-12-07 ENCOUNTER — Other Ambulatory Visit (HOSPITAL_COMMUNITY)
Admission: RE | Admit: 2023-12-07 | Discharge: 2023-12-07 | Disposition: A | Discharge status: Home / Self Care | Payer: Self-pay | Source: Ambulatory Visit | Attending: Oncology | Admitting: Oncology

## 2023-12-07 DIAGNOSIS — Z006 Encounter for examination for normal comparison and control in clinical research program: Secondary | ICD-10-CM | POA: Insufficient documentation

## 2023-12-16 LAB — GENECONNECT MOLECULAR SCREEN: Genetic Analysis Overall Interpretation: NEGATIVE

## 2024-01-08 DIAGNOSIS — L430 Hypertrophic lichen planus: Secondary | ICD-10-CM | POA: Diagnosis not present

## 2024-01-08 DIAGNOSIS — D485 Neoplasm of uncertain behavior of skin: Secondary | ICD-10-CM | POA: Diagnosis not present

## 2024-04-09 ENCOUNTER — Ambulatory Visit (INDEPENDENT_AMBULATORY_CARE_PROVIDER_SITE_OTHER): Admitting: Obstetrics and Gynecology

## 2024-04-09 ENCOUNTER — Encounter: Payer: Self-pay | Admitting: Obstetrics and Gynecology

## 2024-04-09 VITALS — BP 112/64 | HR 100 | Temp 98.0°F | Ht 65.0 in | Wt 153.0 lb

## 2024-04-09 DIAGNOSIS — E042 Nontoxic multinodular goiter: Secondary | ICD-10-CM

## 2024-04-09 DIAGNOSIS — Z01419 Encounter for gynecological examination (general) (routine) without abnormal findings: Secondary | ICD-10-CM | POA: Diagnosis not present

## 2024-04-09 DIAGNOSIS — Z1331 Encounter for screening for depression: Secondary | ICD-10-CM

## 2024-04-09 NOTE — Patient Instructions (Signed)

## 2024-04-09 NOTE — Assessment & Plan Note (Addendum)
 Discussed perimenopausal sx Reviewed sleep hygiene, including limiting screen time prior to bed, cooler ambient temperatures, and regular physical activity. Patient can consider using melatonin and magnesium to help with sleep.  Cervical cancer screening performed according to ASCCP guidelines. Encouraged annual mammogram screening Colonoscopy N/A DXA N/A Labs and immunizations with her primary Encouraged safe sexual practices as indicated Encouraged healthy lifestyle practices with diet and exercise For patients under 50yo, I recommend 1000mg  calcium daily and 600IU of vitamin D  daily.

## 2024-04-09 NOTE — Progress Notes (Signed)
 43 y.o. [id number] female with multi nodular goiter and IBS here for annual exam. Married, vasectomy. 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  Patient's last menstrual period was 03/28/2024 (approximate). Period Duration (Days): 3-4 Period Pattern: Regular Menstrual Flow: Light Menstrual Control: Maxi pad, Tampon Dysmenorrhea: (!) Mild Dysmenorrhea Symptoms: Headache  Noticing more lighter periods. Also now every 24-26d, used to be q28d. Notices increased breast tenderness.  Having some sleep disturbances, notes husband snores.  Abnormal bleeding: as noted Pelvic discharge or pain: none Breast mass, nipple discharge or skin changes : none Birth control: vasectomy Last PAP:     Component Value Date/Time   DIAGPAP  03/31/2022 1434    - Negative for Intraepithelial Lesions or Malignancy (NILM)   DIAGPAP - Benign reactive/reparative changes 03/31/2022 1434   HPVHIGH Negative 03/31/2022 1434   ADEQPAP  03/31/2022 1434    Satisfactory for evaluation; transformation zone component PRESENT.   Last mammogram: 11/14/2023 BI-RADS 1, density C Last colonoscopy: 2013 Sexually active: yes  Exercising: walking and strength Smoker: no  Garment/textile technologist Visit from 04/09/2024 in [facility]  PHQ-2 Total Score 0       Flowsheet Row Office Visit from 10/10/2023 in [facility] at [facility]  PHQ-9 Total Score 1       GYN HISTORY: No significant hx  OB History  Gravida Para Term Preterm AB Living  5 3 3  0 2 3  SAB IAB Ectopic Multiple Live Births  2 0 0 0 3    # Outcome Date GA Lbr Len/2nd Weight Sex Type Anes PTL Lv  5 Term 02/28/12  03:29 / 00:02 7 lb 11 oz (3.487 kg) F Vag-Spont None  LIV     Birth Comments: [id number]  4 Term 12/2009 [redacted]w[redacted]d  7 lb 11 oz (3.487 kg) F Vag-Spont   LIV  3 Term 04/2008 [redacted]w[redacted]d  7 lb 15 oz (3.6 kg) F Vag-Spont   LIV  2 SAB 2008          1 SAB 2007            Past Medical History:   Diagnosis Date   Blood in stool    Chicken pox    Enlarged thyroid     Frequent headaches    GERD (gastroesophageal reflux disease) 2003   IBS (irritable bowel syndrome)    Multinodular goiter    UTI (urinary tract infection)     Past Surgical History:  Procedure Laterality Date   DILATION AND CURETTAGE OF UTERUS  06/05/2007   TONSILLECTOMY AND ADENOIDECTOMY  1998; Age 41    Current Outpatient Medications on File Prior to Visit  Medication Sig Dispense Refill   Cetirizine HCl (ZYRTEC PO) Take by mouth.     Cholecalciferol (VITAMIN D  PO) Take 2,000 Int'l Units by mouth daily.     Multiple Vitamins-Minerals (MULTIVITAMIN PO) Take 1 tablet by mouth daily.      No current facility-administered medications on file prior to visit.    Social History   Socioeconomic History   Marital status: Married    Spouse name: Not on file   Number of children: 3   Years of education: Not on file   Highest education level: Bachelor's degree (e.g., BA, AB, BS)  Occupational History   Occupation: home maker  Tobacco Use   Smoking status: Never   Smokeless tobacco: Never  Vaping Use   Vaping status: Never Used  Substance and Sexual Activity  Alcohol use: Yes    Alcohol/week: 1.0 standard drink of alcohol    Types: 1 Standard drinks or equivalent per week   Drug use: No   Sexual activity: Yes    Partners: Male    Birth control/protection: Surgical    Comment: Husband - vasectomy  Other Topics Concern   Not on file  Social History Narrative   Not on file   Social Drivers of Health   Financial Resource Strain: Low Risk  (10/03/2023)   Overall Financial Resource Strain (CARDIA)    Difficulty of Paying Living Expenses: Not hard at all  Food Insecurity: No Food Insecurity (10/03/2023)   Hunger Vital Sign    Worried About Running Out of Food in the Last Year: Never true    Ran Out of Food in the Last Year: Never true  Transportation Needs: No Transportation Needs (10/03/2023)    PRAPARE - Administrator, Civil Service (Medical): No    Lack of Transportation (Non-Medical): No  Physical Activity: Insufficiently Active (10/03/2023)   Exercise Vital Sign    Days of Exercise per Week: 3 days    Minutes of Exercise per Session: 30 min  Stress: No Stress Concern Present (10/03/2023)   [employer] of Occupational Health - Occupational Stress Questionnaire    Feeling of Stress : Only a little  Social Connections: Socially Integrated (10/03/2023)   Social Connection and Isolation Panel [NHANES]    Frequency of Communication with Friends and Family: More than three times a week    Frequency of Social Gatherings with Friends and Family: Three times a week    Attends Religious Services: More than 4 times per year    Active Member of Clubs or Organizations: Yes    Attends Banker Meetings: More than 4 times per year    Marital Status: Married  Catering manager Violence: Not At Risk (10/10/2023)   Humiliation, Afraid, Rape, and Kick questionnaire    Fear of Current or Ex-Partner: No    Emotionally Abused: No    Physically Abused: No    Sexually Abused: No    Family History  Problem Relation Age of Onset   Thyroid  disease Mother    Hypertension Mother    Varicose Veins Mother    Irritable bowel syndrome Father    Arthritis Father    Spina bifida Sister    Birth defects Sister    Diabetes Maternal Grandmother    Stroke Maternal Grandmother    Cancer Maternal Grandfather    Alcohol abuse Maternal Grandfather    Ulcerative colitis Paternal Grandmother    Alcohol abuse Paternal Grandmother    Arthritis Paternal Grandmother    Hearing loss Paternal Grandfather     Allergies  Allergen Reactions   Codeine Nausea And Vomiting and Other (See Comments)    Stomach pain      PE Today's Vitals   04/09/24 0812  BP: 112/64  Pulse: 100  Temp: 98 F (36.7 C)  TempSrc: Oral  SpO2: 99%  Weight: 153 lb (69.4 kg)  Height: 5\' 5"  (1.651 m)    Body mass index is 25.46 kg/m.  Physical Exam Vitals reviewed. Exam conducted with a chaperone present.  Constitutional:      General: She is not in acute distress.    Appearance: Normal appearance.  HENT:     Head: Normocephalic and atraumatic.     Nose: Nose normal.  Eyes:     Extraocular Movements: Extraocular movements intact.  Conjunctiva/sclera: Conjunctivae normal.  Neck:     Thyroid : No thyroid  mass, thyromegaly or thyroid  tenderness.  Pulmonary:     Effort: Pulmonary effort is normal.  Chest:     Chest wall: No mass or tenderness.  Breasts:    Right: Normal. No swelling, mass, nipple discharge, skin change or tenderness.     Left: Normal. No swelling, mass, nipple discharge, skin change or tenderness.  Abdominal:     General: There is no distension.     Palpations: Abdomen is soft.     Tenderness: There is no abdominal tenderness.  Genitourinary:    General: Normal vulva.     Exam position: Lithotomy position.     Urethra: No prolapse.     Vagina: Normal. No vaginal discharge or bleeding.     Cervix: Normal. No lesion.     Uterus: Normal. Not enlarged and not tender.      Adnexa: Right adnexa normal and left adnexa normal.  Musculoskeletal:        General: Normal range of motion.     Cervical back: Normal range of motion.  Lymphadenopathy:     Upper Body:     Right upper body: No axillary adenopathy.     Left upper body: No axillary adenopathy.     Lower Body: No right inguinal adenopathy. No left inguinal adenopathy.  Skin:    General: Skin is warm and dry.  Neurological:     General: No focal deficit present.     Mental Status: She is alert.  Psychiatric:        Mood and Affect: Mood normal.        Behavior: Behavior normal.      Assessment and Plan:        Well woman exam with routine gynecological exam Assessment & Plan: Discussed perimenopausal sx Reviewed sleep hygiene, including limiting screen time prior to bed, cooler ambient  temperatures, and regular physical activity. Patient can consider using melatonin and magnesium to help with sleep.  Cervical cancer screening performed according to ASCCP guidelines. Encouraged annual mammogram screening Colonoscopy N/A DXA N/A Labs and immunizations with her primary Encouraged safe sexual practices as indicated Encouraged healthy lifestyle practices with diet and exercise For patients under 50yo, I recommend 1000mg  calcium daily and 600IU of vitamin D  daily.    Negative depression screening  Multinodular goiter (nontoxic) -     Thyroid  Panel With TSH  Request TSH here annually Romaine Closs, MD

## 2024-04-10 ENCOUNTER — Encounter: Payer: Self-pay | Admitting: Obstetrics and Gynecology

## 2024-04-10 LAB — THYROID PANEL WITH TSH
Free Thyroxine Index: 2.4 (ref 1.4–3.8)
T3 Uptake: 30 % (ref 22–35)
T4, Total: 7.9 ug/dL (ref 5.1–11.9)
TSH: 0.83 m[IU]/L

## 2024-10-02 ENCOUNTER — Ambulatory Visit (HOSPITAL_BASED_OUTPATIENT_CLINIC_OR_DEPARTMENT_OTHER): Admitting: Family Medicine

## 2024-10-02 ENCOUNTER — Encounter (HOSPITAL_BASED_OUTPATIENT_CLINIC_OR_DEPARTMENT_OTHER): Payer: Self-pay | Admitting: Family Medicine

## 2024-10-02 VITALS — BP 118/84 | HR 94 | Temp 98.3°F | Ht 65.0 in | Wt 152.0 lb

## 2024-10-02 DIAGNOSIS — M2550 Pain in unspecified joint: Secondary | ICD-10-CM | POA: Diagnosis not present

## 2024-10-02 DIAGNOSIS — W57XXXA Bitten or stung by nonvenomous insect and other nonvenomous arthropods, initial encounter: Secondary | ICD-10-CM

## 2024-10-02 DIAGNOSIS — G44209 Tension-type headache, unspecified, not intractable: Secondary | ICD-10-CM

## 2024-10-02 MED ORDER — BUTALBITAL-APAP-CAFFEINE 50-325-40 MG PO TABS: 1.0000 | ORAL_TABLET | Freq: Four times a day (QID) | ORAL | 0 refills | Status: AC | PRN

## 2024-10-02 MED ORDER — CYCLOBENZAPRINE HCL 5 MG PO TABS: 5.0000 mg | ORAL_TABLET | Freq: Three times a day (TID) | ORAL | 1 refills | Status: AC | PRN

## 2024-10-02 NOTE — Progress Notes (Signed)
 Acute Care Office Visit  Subjective:   Sherry Chavez 11-Jan-1981 10/02/2024  Chief Complaint  Patient presents with   Headache    Pt has had a headache which began about 1 week ago and states she has had pain/tension in neck and shoulders. States when she turns her head, she has felt dizzy. Did state that she has had multiple tick bites.    HPI: Discussed the use of AI scribe software for clinical note transcription with the patient, who gave verbal consent to proceed.  History of Present Illness Sherry Chavez is a 43 year old female who presents with persistent headaches and joint pain.  She has been experiencing significant headaches that began last Monday, affecting her head, neck, and shoulders, with tender spots at the base of her skull. Initially, the headaches lasted three to four days and then began migrating to different areas of her head, including behind her eye. Yesterday and today have been the best days, but she still experiences pain. The headaches are associated with dizziness when turning her head quickly, and she feels easily off balance. She reports visual disturbances, such as flashes of light and residual streaks in her vision, lasting about 30 seconds to a minute. No sensitivity to light, fevers, chills, congestion, sore throat, or cough. She has a history of visual migraines, characterized by a spot in one eye that enlarges and then resolves, but she has not been on any medication for this. Occasionally, she experiences ringing in her ears, lasting about an hour.  She also reports neck and shoulder pain, which has improved slightly, but her shoulder joints and knees continue to hurt. She experiences tightness in her shoulders, especially when trying to sleep, and cannot fully raise her shoulders. She mentions a low back pinch when looking down. She has been using Motrin  and Tylenol  for pain relief, which help temporarily, but the pain returns once  the medication wears off. Mornings are the best time, with symptoms worsening throughout the day. No new exercises or weight lifting and she has not been exercising due to feeling unwell.  She is concerned about multiple tick bites over the past summer, with two occurring in the last month. One tick was found in her ear canal, which she discovered after experiencing pain while using earbuds. She has not noticed any rashes. She has experienced a few episodes of nausea, which she attributes to severe headaches, but no prolonged nausea.    The following portions of the patient's history were reviewed and updated as appropriate: past medical history, past surgical history, family history, social history, allergies, medications, and problem list.   Patient Active Problem List   Diagnosis Date Noted   Well woman exam with routine gynecological exam 04/09/2024   Multinodular goiter    Routine general medical examination at a health care facility 05/18/2018   Multinodular goiter (nontoxic) 05/02/2017   Irritable bowel syndrome 01/03/2014   Past Medical History:  Diagnosis Date   Blood in stool    Chicken pox    Enlarged thyroid     Frequent headaches    GERD (gastroesophageal reflux disease) 2003   IBS (irritable bowel syndrome)    Multinodular goiter    UTI (urinary tract infection)    Past Surgical History:  Procedure Laterality Date   DILATION AND CURETTAGE OF UTERUS  06/05/2007   TONSILLECTOMY AND ADENOIDECTOMY  1998; Age 12   Family History  Problem Relation Age of Onset  Thyroid  disease Mother    Hypertension Mother    Varicose Veins Mother    Irritable bowel syndrome Father    Arthritis Father    Spina bifida Sister    Birth defects Sister    Diabetes Maternal Grandmother    Stroke Maternal Grandmother    Cancer Maternal Grandfather    Alcohol abuse Maternal Grandfather    Ulcerative colitis Paternal Grandmother    Alcohol abuse Paternal Grandmother    Arthritis Paternal  Grandmother    Hearing loss Paternal Grandfather    Outpatient Medications Prior to Visit  Medication Sig Dispense Refill   Cetirizine HCl (ZYRTEC PO) Take by mouth.     Cholecalciferol (VITAMIN D  PO) Take 2,000 Int'l Units by mouth daily.     Multiple Vitamins-Minerals (MULTIVITAMIN PO) Take 1 tablet by mouth daily.      No facility-administered medications prior to visit.   Allergies  Allergen Reactions   Codeine Nausea And Vomiting and Other (See Comments)    Stomach pain     ROS: A complete ROS was performed with pertinent positives/negatives noted in the HPI. The remainder of the ROS are negative.    Objective:   Today's Vitals   10/02/24 1038  BP: 118/84  Pulse: 94  Temp: 98.3 F (36.8 C)  TempSrc: Oral  SpO2: 100%  Weight: 152 lb (68.9 kg)  Height: 5' 5 (1.651 m)    GENERAL: Well-appearing, in NAD. Well nourished.  SKIN: Pink, warm and dry.  Head: Normocephalic. NECK: Trachea midline. Full ROM w/o pain or tenderness. No lymphadenopathy.  EARS: Tympanic membranes are intact, translucent without bulging and without drainage. Appropriate landmarks visualized.  EYES: Conjunctiva clear without exudates. EOMI, PERRL, no drainage present.  NOSE: Septum midline w/o deformity. Nares patent, mucosa pink and non-inflamed w/o drainage. No sinus tenderness.  THROAT: Uvula midline. Oropharynx clear. Tonsils non-inflamed without exudate. Mucous membranes pink and moist.  RESPIRATORY: Chest wall symmetrical. Respirations even and non-labored.  MSK: Muscle tone and strength appropriate for age. Joints w/o tenderness, redness, or swelling.  EXTREMITIES: Without clubbing, cyanosis, or edema.  NEUROLOGIC: No motor or sensory deficits. Steady, even gait. C2-C12 intact.  PSYCH/MENTAL STATUS: Alert, oriented x 3. Cooperative, appropriate mood and affect.    Neurological Exam:  Mental status: alert and oriented to person, place, time and self, speech fluent and not dysarthric,  language intact.  Fund of knowledge is appropriate.  Recent and remote memory are intact.  Attention and concentration are normal.    Able to name objects and repeat phrases.  Cranial nerves:  CN I: not tested  CN II: pupils equal, round and reactive to light, visual fields intact, fundi unremarkable.  CN III, IV, VI: full range of motion, no nystagmus, no ptosis  CN V: facial sensation intact  CN VII: upper and lower face symmetric  CN VIII: hearing intact  CN IX, X: gag intact, uvula midline  CN XI: sternocleidomastoid and trapezius muscles intact  CN XII: tongue midline  Bulk & Tone: normal, no fasciculations.  Motor: 5/5 throughout  Cerebellar: no incoordination on finger to nose, heel to shin. No dysdiadochokinesia  Romberg negative.     No results found for any visits on 10/02/24.    Assessment & Plan:  1. Tick bite, unspecified site, initial encounter (Primary) 2. Arthralgia, unspecified joint Discussed concern of tick bite with new onset of arthralgias. No rash presence. Will proceed with lab work for lyme serology, spotted fever antibodies and CBC.  - Lyme Disease  Serology w/Reflex - CBC with Differential - Spotted Fever Group Antibodies  3. Tension headache Possibly tension related given tightness in trapezius, shoulders, and neck. Will start ice, heat, use Fioricet as needed and use Flexeril  at night as needed. Stretching provided with AVS. Red flag signs and symptoms reviewed.   - butalbital-acetaminophen -caffeine (FIORICET) 50-325-40 MG tablet; Take 1 tablet by mouth every 6 (six) hours as needed for headache.  Dispense: 14 tablet; Refill: 0 - cyclobenzaprine  (FLEXERIL ) 5 MG tablet; Take 1 tablet (5 mg total) by mouth 3 (three) times daily as needed for muscle spasms.  Dispense: 30 tablet; Refill: 1   Return if symptoms worsen or fail to improve.    Patient to reach out to office if new, worrisome, or unresolved symptoms arise or if no improvement in patient's  condition. Patient verbalized understanding and is agreeable to treatment plan. All questions answered to patient's satisfaction.    Thersia Schuyler Stark, [place]

## 2024-10-03 LAB — CBC WITH DIFFERENTIAL/PLATELET
Basophils Absolute: 0.1 x10E3/uL (ref 0.0–0.2)
Basos: 1 %
EOS (ABSOLUTE): 0.1 x10E3/uL (ref 0.0–0.4)
Eos: 1 %
Hematocrit: 44.4 % (ref 34.0–46.6)
Hemoglobin: 14.7 g/dL (ref 11.1–15.9)
Immature Grans (Abs): 0 x10E3/uL (ref 0.0–0.1)
Immature Granulocytes: 0 %
Lymphocytes Absolute: 2.6 x10E3/uL (ref 0.7–3.1)
Lymphs: 54 %
MCH: 29.8 pg (ref 26.6–33.0)
MCHC: 33.1 g/dL (ref 31.5–35.7)
MCV: 90 fL (ref 79–97)
Monocytes Absolute: 0.4 x10E3/uL (ref 0.1–0.9)
Monocytes: 8 %
Neutrophils Absolute: 1.7 x10E3/uL (ref 1.4–7.0)
Neutrophils: 36 %
Platelets: 254 x10E3/uL (ref 150–450)
RBC: 4.93 x10E6/uL (ref 3.77–5.28)
RDW: 12.3 % (ref 11.7–15.4)
WBC: 4.9 x10E3/uL (ref 3.4–10.8)

## 2024-10-03 LAB — SPOTTED FEVER GROUP ANTIBODIES
Spotted Fever Group IgG: <1:64 {titer}
Spotted Fever Group IgM: <1:64 {titer}

## 2024-10-03 LAB — LYME DISEASE SEROLOGY W/REFLEX: Lyme Total Antibody EIA: NEGATIVE

## 2024-10-04 ENCOUNTER — Ambulatory Visit (HOSPITAL_BASED_OUTPATIENT_CLINIC_OR_DEPARTMENT_OTHER): Payer: Self-pay | Admitting: Family Medicine

## 2024-10-04 NOTE — Progress Notes (Signed)
 Hi Sherry Chavez, Your Lyme disease and [place] spotted fever antibodies are normal.  Your CBC is stable without any signs of acute infection.

## 2024-10-09 ENCOUNTER — Ambulatory Visit (HOSPITAL_BASED_OUTPATIENT_CLINIC_OR_DEPARTMENT_OTHER): Payer: 59 | Admitting: Family Medicine

## 2024-10-17 ENCOUNTER — Other Ambulatory Visit: Payer: Self-pay | Admitting: Obstetrics and Gynecology

## 2024-10-17 DIAGNOSIS — Z1231 Encounter for screening mammogram for malignant neoplasm of breast: Secondary | ICD-10-CM

## 2024-11-15 ENCOUNTER — Ambulatory Visit: Admission: RE | Admit: 2024-11-15 | Discharge: 2024-11-15 | Disposition: A | Source: Ambulatory Visit

## 2024-11-15 DIAGNOSIS — Z1231 Encounter for screening mammogram for malignant neoplasm of breast: Secondary | ICD-10-CM

## 2024-11-22 ENCOUNTER — Ambulatory Visit: Payer: Self-pay | Admitting: Obstetrics and Gynecology

## 2025-04-10 ENCOUNTER — Ambulatory Visit: Admitting: Obstetrics and Gynecology
